# Patient Record
Sex: Female | Born: 1975 | Race: White | Hispanic: No | Smoking: Current every day smoker
Health system: Southern US, Community
[De-identification: ages and names within clinical notes are randomized; demographics above are authoritative.]

## PROBLEM LIST (undated history)

## (undated) DIAGNOSIS — K219 Gastro-esophageal reflux disease without esophagitis: Secondary | ICD-10-CM

## (undated) DIAGNOSIS — R569 Unspecified convulsions: Secondary | ICD-10-CM

## (undated) DIAGNOSIS — F319 Bipolar disorder, unspecified: Secondary | ICD-10-CM

## (undated) DIAGNOSIS — M1990 Unspecified osteoarthritis, unspecified site: Secondary | ICD-10-CM

## (undated) DIAGNOSIS — IMO0002 Reserved for concepts with insufficient information to code with codable children: Secondary | ICD-10-CM

## (undated) DIAGNOSIS — F909 Attention-deficit hyperactivity disorder, unspecified type: Secondary | ICD-10-CM

## (undated) HISTORY — PX: OTHER SURGICAL HISTORY: SHX169

## (undated) HISTORY — PX: TUBAL LIGATION: SHX77

## (undated) HISTORY — DX: Reserved for concepts with insufficient information to code with codable children: IMO0002

## (undated) HISTORY — DX: Attention-deficit hyperactivity disorder, unspecified type: F90.9

## (undated) HISTORY — DX: Gastro-esophageal reflux disease without esophagitis: K21.9

## (undated) HISTORY — DX: Unspecified osteoarthritis, unspecified site: M19.90

## (undated) HISTORY — DX: Bipolar disorder, unspecified: F31.9

## (undated) HISTORY — DX: Unspecified convulsions: R56.9

---

## 2014-05-02 ENCOUNTER — Emergency Department: Payer: Self-pay | Admitting: Student

## 2015-07-12 ENCOUNTER — Other Ambulatory Visit (HOSPITAL_COMMUNITY)
Admission: RE | Admit: 2015-07-12 | Discharge: 2015-07-12 | Disposition: A | Discharge status: Home / Self Care | Payer: BLUE CROSS/BLUE SHIELD | Source: Ambulatory Visit | Attending: Obstetrics and Gynecology | Admitting: Obstetrics and Gynecology

## 2015-07-12 DIAGNOSIS — Z01411 Encounter for gynecological examination (general) (routine) with abnormal findings: Secondary | ICD-10-CM | POA: Insufficient documentation

## 2015-07-12 DIAGNOSIS — Z1151 Encounter for screening for human papillomavirus (HPV): Secondary | ICD-10-CM | POA: Diagnosis present

## 2018-05-27 ENCOUNTER — Emergency Department (HOSPITAL_COMMUNITY): Payer: BLUE CROSS/BLUE SHIELD

## 2018-05-27 ENCOUNTER — Emergency Department (HOSPITAL_COMMUNITY)
Admission: EM | Admit: 2018-05-27 | Discharge: 2018-05-27 | Disposition: A | Discharge status: Home / Self Care | Payer: BLUE CROSS/BLUE SHIELD | Attending: Emergency Medicine | Admitting: Emergency Medicine

## 2018-05-27 ENCOUNTER — Other Ambulatory Visit: Payer: Self-pay

## 2018-05-27 ENCOUNTER — Encounter (HOSPITAL_COMMUNITY): Payer: Self-pay | Admitting: Emergency Medicine

## 2018-05-27 DIAGNOSIS — R69 Illness, unspecified: Secondary | ICD-10-CM

## 2018-05-27 DIAGNOSIS — R05 Cough: Secondary | ICD-10-CM

## 2018-05-27 DIAGNOSIS — J111 Influenza due to unidentified influenza virus with other respiratory manifestations: Secondary | ICD-10-CM | POA: Insufficient documentation

## 2018-05-27 DIAGNOSIS — F17290 Nicotine dependence, other tobacco product, uncomplicated: Secondary | ICD-10-CM | POA: Insufficient documentation

## 2018-05-27 DIAGNOSIS — R059 Cough, unspecified: Secondary | ICD-10-CM

## 2018-05-27 LAB — PREGNANCY, URINE: Preg Test, Ur: NEGATIVE

## 2018-05-27 MED ORDER — HYDROCOD POLST-CPM POLST ER 10-8 MG/5ML PO SUER
5.0000 mL | Freq: Once | ORAL | Status: AC
  Administered 2018-05-27: 5 mL via ORAL
  Filled 2018-05-27: qty 5

## 2018-05-27 MED ORDER — PROMETHAZINE-DM 6.25-15 MG/5ML PO SYRP: 5.0000 mL | ORAL_SOLUTION | Freq: Four times a day (QID) | ORAL | 0 refills | Status: DC | PRN

## 2018-05-27 MED ORDER — ALBUTEROL SULFATE HFA 108 (90 BASE) MCG/ACT IN AERS: 1.0000 | INHALATION_SPRAY | Freq: Four times a day (QID) | RESPIRATORY_TRACT | 0 refills | Status: DC | PRN

## 2018-05-27 MED ORDER — ALBUTEROL SULFATE (2.5 MG/3ML) 0.083% IN NEBU
2.5000 mg | INHALATION_SOLUTION | Freq: Once | RESPIRATORY_TRACT | Status: AC
  Administered 2018-05-27: 2.5 mg via RESPIRATORY_TRACT
  Filled 2018-05-27: qty 3

## 2018-05-27 MED ORDER — IPRATROPIUM-ALBUTEROL 0.5-2.5 (3) MG/3ML IN SOLN
3.0000 mL | Freq: Once | RESPIRATORY_TRACT | Status: AC
  Administered 2018-05-27: 3 mL via RESPIRATORY_TRACT
  Filled 2018-05-27: qty 3

## 2018-05-27 MED ORDER — PREDNISONE 20 MG PO TABS: 40.0000 mg | ORAL_TABLET | Freq: Every day | ORAL | 0 refills | Status: DC

## 2018-05-27 NOTE — Discharge Instructions (Addendum)
1 to 2 puffs of the albuterol inhaler every 4-6 hours as needed.  Drink plenty of fluids.  Continue to alternate Tylenol and ibuprofen every 4 and 6 hours for fever and body aches.  Follow-up with your primary doctor or return here for any worsening symptoms.

## 2018-05-27 NOTE — ED Triage Notes (Signed)
Headache, body aches, and chills since Friday.

## 2018-05-27 NOTE — ED Provider Notes (Signed)
[facility] EMERGENCY DEPARTMENT Provider Note   CSN: [id number] Arrival date & time: 05/27/18  1338     History   Chief Complaint Chief Complaint  Patient presents with  . Influenza    HPI Emily [name] is a 43 y.o. female.  HPI   Emily [name] is a 43 y.o. female who presents to the Emergency Department complaining of generalized body aches, frontal headache, sweats and chills with cough.  Symptoms have been present since Friday.  She describes the cough as forceful and mostly nonproductive and the excessive coughing triggers the headache.  Fever at home is subjective but states she has intermittent sweats and chills which makes her believe she is also had a fever.  She is taking over-the-counter cold and flu medication with minimal to no relief.  She does endorse recent sick contacts.  She states the cough worsens with exertion and with lying down.  She has noticed some wheezing.  She denies chest pain, abdominal pain, nausea or vomiting, and dysuria.   History reviewed. No pertinent past medical history.  There are no active problems to display for this patient.   Past Surgical History:  Procedure Laterality Date  . TUBAL LIGATION       OB History    Gravida  3   Para  3   Term  3   Preterm      AB      Living        SAB      TAB      Ectopic      Multiple      Live Births               Home Medications    Prior to Admission medications   Not on File    Family History Family History  Problem Relation Age of Onset  . Heart disease Other   . Cancer Other     Social History Social History   Tobacco Use  . Smoking status: Current Some Day Smoker    Types: Cigars  . Smokeless tobacco: Former Engineer, water Use Topics  . Alcohol use: Yes    Comment: rarely  . Drug use: Never     Allergies   Patient has no known allergies.   Review of Systems Review of Systems  Constitutional: Positive for chills and fever. Negative  for appetite change.  HENT: Positive for congestion, rhinorrhea and sore throat. Negative for trouble swallowing and voice change.   Respiratory: Positive for cough and wheezing. Negative for chest tightness and shortness of breath.   Cardiovascular: Negative for chest pain.  Gastrointestinal: Negative for abdominal pain, diarrhea, nausea and vomiting.  Genitourinary: Negative for decreased urine volume and dysuria.  Musculoskeletal: Positive for myalgias (Generalized body aches). Negative for arthralgias.  Skin: Negative for rash.  Neurological: Positive for headaches. Negative for dizziness, syncope, weakness and numbness.  Hematological: Negative for adenopathy.     Physical Exam Updated Vital Signs BP 135/89 (BP Location: Right Arm)   Pulse 96   Temp 99.3 F (37.4 C) (Temporal)   Resp (!) 24   Ht 5\' 3"  (1.6 m)   Wt 90.7 kg   LMP 04/15/2018   SpO2 94%   BMI 35.43 kg/m   Physical Exam Vitals signs and nursing note reviewed.  Constitutional:      General: She is not in acute distress.    Appearance: Normal appearance. She is not ill-appearing or toxic-appearing.  HENT:  Head: Normocephalic.     Right Ear: Tympanic membrane and ear canal normal.     Left Ear: Tympanic membrane and ear canal normal.     Nose: Congestion present. No rhinorrhea.     Mouth/Throat:     Mouth: Mucous membranes are moist.     Pharynx: Posterior oropharyngeal erythema present.     Comments: Erythema of the oropharynx without exudate or edema.  Uvula is midline and nonedematous. Neck:     Musculoskeletal: Normal range of motion. No neck rigidity.  Cardiovascular:     Rate and Rhythm: Normal rate and regular rhythm.     Pulses: Normal pulses.  Pulmonary:     Effort: Pulmonary effort is normal.     Breath sounds: Wheezing present.     Comments: Lung sounds slightly diminished bilaterally with expiratory wheezes present.  No rales. Musculoskeletal: Normal range of motion.     Right lower  leg: No edema.     Left lower leg: No edema.  Lymphadenopathy:     Cervical: No cervical adenopathy.  Skin:    General: Skin is warm.     Capillary Refill: Capillary refill takes less than 2 seconds.     Findings: No rash.  Neurological:     General: No focal deficit present.     Mental Status: She is alert.     Motor: No weakness.      ED Treatments / Results  Labs (all labs ordered are listed, but only abnormal results are displayed) Labs Reviewed  PREGNANCY, URINE    EKG None  Radiology Dg Chest 2 View  Result Date: 05/27/2018 CLINICAL DATA:  Headache, body aches, chills EXAM: CHEST - 2 VIEW COMPARISON:  05/02/2014 FINDINGS: The heart size and mediastinal contours are within normal limits. Both lungs are clear. The visualized skeletal structures are unremarkable. IMPRESSION: No active cardiopulmonary disease. Electronically Signed   By: Elige Ko   On: 05/27/2018 15:42    Procedures Procedures (including critical care time)  Medications Ordered in ED Medications  ipratropium-albuterol (DUONEB) 0.5-2.5 (3) MG/3ML nebulizer solution 3 mL (has no administration in time range)  albuterol (PROVENTIL) (2.5 MG/3ML) 0.083% nebulizer solution 2.5 mg (has no administration in time range)  chlorpheniramine-HYDROcodone (TUSSIONEX) 10-8 MG/5ML suspension 5 mL (has no administration in time range)     Initial Impression / Assessment and Plan / ED Course  I have reviewed the triage vital signs and the nursing notes.  Pertinent labs & imaging results that were available during my care of the patient were reviewed by me and considered in my medical decision making (see chart for details).    Patient well-appearing.  Nontoxic.  Cough generalized body aches and fever for 3 days.  Symptoms are likely viral.  She did have few scattered expiratory wheezes on initial exam that have improved after albuterol neb.  No hypoxia or tachycardia.  She appears appropriate for discharge home,  return precautions discussed.   Final Clinical Impressions(s) / ED Diagnoses   Final diagnoses:  Cough  Influenza-like illness    ED Discharge Orders    None       Rosey Bath 05/27/18 1702    Bethann Berkshire, MD 05/27/18 2332

## 2019-07-13 ENCOUNTER — Emergency Department (HOSPITAL_COMMUNITY): Payer: BC Managed Care – PPO

## 2019-07-13 ENCOUNTER — Encounter (HOSPITAL_COMMUNITY): Payer: Self-pay | Admitting: Emergency Medicine

## 2019-07-13 ENCOUNTER — Other Ambulatory Visit: Payer: Self-pay

## 2019-07-13 ENCOUNTER — Emergency Department (HOSPITAL_COMMUNITY)
Admission: EM | Admit: 2019-07-13 | Discharge: 2019-07-14 | Disposition: A | Discharge status: Home / Self Care | Payer: BC Managed Care – PPO | Attending: Emergency Medicine | Admitting: Emergency Medicine

## 2019-07-13 DIAGNOSIS — F17290 Nicotine dependence, other tobacco product, uncomplicated: Secondary | ICD-10-CM | POA: Diagnosis not present

## 2019-07-13 DIAGNOSIS — R112 Nausea with vomiting, unspecified: Secondary | ICD-10-CM | POA: Diagnosis not present

## 2019-07-13 DIAGNOSIS — R5383 Other fatigue: Secondary | ICD-10-CM | POA: Diagnosis not present

## 2019-07-13 DIAGNOSIS — N12 Tubulo-interstitial nephritis, not specified as acute or chronic: Secondary | ICD-10-CM | POA: Insufficient documentation

## 2019-07-13 DIAGNOSIS — F1210 Cannabis abuse, uncomplicated: Secondary | ICD-10-CM | POA: Insufficient documentation

## 2019-07-13 DIAGNOSIS — R1110 Vomiting, unspecified: Secondary | ICD-10-CM | POA: Diagnosis present

## 2019-07-13 DIAGNOSIS — R109 Unspecified abdominal pain: Secondary | ICD-10-CM | POA: Insufficient documentation

## 2019-07-13 LAB — COMPREHENSIVE METABOLIC PANEL
ALT: 14 U/L (ref 0–44)
AST: 14 U/L — ABNORMAL LOW (ref 15–41)
Albumin: 4.3 g/dL (ref 3.5–5.0)
Alkaline Phosphatase: 65 U/L (ref 38–126)
Anion gap: 10 (ref 5–15)
BUN: 9 mg/dL (ref 6–20)
CO2: 25 mmol/L (ref 22–32)
Calcium: 9.5 mg/dL (ref 8.9–10.3)
Chloride: 105 mmol/L (ref 98–111)
Creatinine, Ser: 0.58 mg/dL (ref 0.44–1.00)
GFR calc Af Amer: >60 mL/min (ref >60)
GFR calc non Af Amer: >60 mL/min (ref >60)
Glucose, Bld: 105 mg/dL — ABNORMAL HIGH (ref 70–99)
Potassium: 3.8 mmol/L (ref 3.5–5.1)
Sodium: 140 mmol/L (ref 135–145)
Total Bilirubin: 0.7 mg/dL (ref 0.3–1.2)
Total Protein: 7.8 g/dL (ref 6.5–8.1)

## 2019-07-13 LAB — CBC
HCT: 44.4 % (ref 36.0–46.0)
Hemoglobin: 14.4 g/dL (ref 12.0–15.0)
MCH: 28.9 pg (ref 26.0–34.0)
MCHC: 32.4 g/dL (ref 30.0–36.0)
MCV: 89.2 fL (ref 80.0–100.0)
Platelets: 478 10*3/uL — ABNORMAL HIGH (ref 150–400)
RBC: 4.98 MIL/uL (ref 3.87–5.11)
RDW: 15 % (ref 11.5–15.5)
WBC: 17.1 10*3/uL — ABNORMAL HIGH (ref 4.0–10.5)
nRBC: 0 % (ref 0.0–0.2)

## 2019-07-13 LAB — URINALYSIS, ROUTINE W REFLEX MICROSCOPIC
Bilirubin Urine: NEGATIVE
Glucose, UA: NEGATIVE mg/dL
Ketones, ur: 20 mg/dL — AB
Nitrite: NEGATIVE
Protein, ur: 100 mg/dL — AB
Specific Gravity, Urine: 1.021 (ref 1.005–1.030)
pH: 6 (ref 5.0–8.0)

## 2019-07-13 LAB — PREGNANCY, URINE: Preg Test, Ur: NEGATIVE

## 2019-07-13 MED ORDER — ONDANSETRON HCL 4 MG/2ML IJ SOLN
4.0000 mg | Freq: Once | INTRAMUSCULAR | Status: AC
  Administered 2019-07-13: 4 mg via INTRAVENOUS
  Filled 2019-07-13: qty 2

## 2019-07-13 MED ORDER — SODIUM CHLORIDE 0.9 % IV BOLUS
1000.0000 mL | Freq: Once | INTRAVENOUS | Status: AC
  Administered 2019-07-13: 22:00:00 1000 mL via INTRAVENOUS

## 2019-07-13 NOTE — ED Provider Notes (Signed)
[facility]-EMERGENCY DEPT [facility] Emergency Department Provider Note MRN:  [id number]  Arrival date & time: 07/14/19     Chief Complaint   Emesis   History of Present Illness   Emily [name] is a 44 y.o. year-old female with a history of tubal ligation presenting to the ED with chief complaint of emesis.  2 days of persistent nonbloody nonbilious emesis.  Explained that she is working overtime and is exhausted.  Denies headache or head trauma, no numbness or weakness, no chest pain or shortness of breath, no abdominal pain, no vaginal bleeding or discharge.  Endorsing moderate to severe right flank pain, gradual onset.  Symptoms constant, moderate, no exacerbating or alleviating factors.  Review of Systems  A complete 10 system review of systems was obtained and all systems are negative except as noted in the HPI and PMH.   Patient's Health History   History reviewed. No pertinent past medical history.  Past Surgical History:  Procedure Laterality Date  . TUBAL LIGATION      Family History  Problem Relation Age of Onset  . Heart disease Other   . Cancer Other     Social History   Socioeconomic History  . Marital status: Divorced    Spouse name: Not on file  . Number of children: Not on file  . Years of education: Not on file  . Highest education level: Not on file  Occupational History  . Not on file  Tobacco Use  . Smoking status: Current Some Day Smoker    Types: Cigars  . Smokeless tobacco: Former Engineer, water and Sexual Activity  . Alcohol use: Yes    Comment: rarely  . Drug use: Yes    Types: Marijuana    Comment: last time 07/11/19  . Sexual activity: Not on file  Other Topics Concern  . Not on file  Social History Narrative  . Not on file   Social Determinants of Health   Financial Resource Strain:   . Difficulty of Paying Living Expenses:   Food Insecurity:   . Worried About Programme researcher, broadcasting/film/video in the Last Year:   . Barista in the  Last Year:   Transportation Needs:   . Freight forwarder (Medical):   Marland Kitchen Lack of Transportation (Non-Medical):   Physical Activity:   . Days of Exercise per Week:   . Minutes of Exercise per Session:   Stress:   . Feeling of Stress :   Social Connections:   . Frequency of Communication with Friends and Family:   . Frequency of Social Gatherings with Friends and Family:   . Attends Religious Services:   . Active Member of Clubs or Organizations:   . Attends Banker Meetings:   Marland Kitchen Marital Status:   Intimate Partner Violence:   . Fear of Current or Ex-Partner:   . Emotionally Abused:   Marland Kitchen Physically Abused:   . Sexually Abused:      Physical Exam   Vitals:   07/13/19 2300 07/13/19 2330  BP: 140/90 (!) 147/92  Pulse: 76 73  Resp: 15 15  Temp:    SpO2: 95% 96%    CONSTITUTIONAL: Well-appearing, NAD NEURO:  Alert and oriented x 3, normal and symmetric strength and sensation, normal coordination, normal speech EYES:  eyes equal and reactive ENT/NECK:  no LAD, no JVD CARDIO: Regular rate, well-perfused, normal S1 and S2 PULM:  CTAB no wheezing or rhonchi GI/GU:  normal bowel sounds, non-distended, non-tender  MSK/SPINE:  No gross deformities, no edema SKIN:  no rash, atraumatic PSYCH:  Appropriate speech and behavior  *Additional and/or pertinent findings included in MDM below  Diagnostic and Interventional Summary    EKG Interpretation  Date/Time:  Sunday July 13 2019 22:02:45 EDT Ventricular Rate:  72 PR Interval:    QRS Duration: 86 QT Interval:  418 QTC Calculation: 458 R Axis:   75 Text Interpretation: Sinus rhythm No previous ECGs available Confirmed by Lihanna Biever ([id number]) on 07/13/2019 10:28:43 PM      Labs Reviewed  URINALYSIS, ROUTINE W REFLEX MICROSCOPIC - Abnormal; Notable for the following components:      Result Value   APPearance CLOUDY (*)    Hgb urine dipstick SMALL (*)    Ketones, ur 20 (*)    Protein, ur 100 (*)     Leukocytes,Ua TRACE (*)    Bacteria, UA RARE (*)    All other components within normal limits  COMPREHENSIVE METABOLIC PANEL - Abnormal; Notable for the following components:   Glucose, Bld 105 (*)    AST 14 (*)    All other components within normal limits  CBC - Abnormal; Notable for the following components:   WBC 17.1 (*)    Platelets 478 (*)    All other components within normal limits  PREGNANCY, URINE    CT RENAL STONE STUDY  Final Result      Medications  cephALEXin (KEFLEX) capsule 500 mg (has no administration in time range)  sodium chloride 0.9 % bolus 1,000 mL (0 mLs Intravenous Stopped 07/13/19 2259)  ondansetron (ZOFRAN) injection 4 mg (4 mg Intravenous Given 07/13/19 2154)     Procedures  /  Critical Care Procedures  ED Course and Medical Decision Making  I have reviewed the triage vital signs, the nursing notes, and pertinent available records from the EMR.  Pertinent labs & imaging results that were available during my care of the patient were reviewed by me and considered in my medical decision making (see below for details).     Vomiting, nothing to suggest CNS etiology, abdomen is soft and nontender  Endorsing right flank pain, considering pyelonephritis, work-up pending.  Labs reveal leukocytosis, mild leukoesterase on urinalysis.  CT scan obtained to exclude possibility of infected kidney stone given the right flank pain, no stone, some indirect evidence of pyelonephritis.  Also some findings of cholelithiasis however patient is without any right upper quadrant tenderness on reassessment.  Patient is now feeling much better, tolerating p.o., appropriate for discharge with antibiotics.  Umer Harig M. Ladon Vandenberghe, MD  Emergency Medicine [facility] [email or web address]  Final Clinical Impressions(s) / ED Diagnoses     ICD-10-CM   1. Non-intractable vomiting with nausea, unspecified vomiting type  R11.2   2. Pyelonephritis  N12     ED  Discharge Orders         Ordered    cephALEXin (KEFLEX) 500 MG capsule  4 times daily     07/14/19 0003    ondansetron (ZOFRAN ODT) 4 MG disintegrating tablet  Every 8 hours PRN     03 /15/21 0004           Discharge Instructions Discussed with and Provided to Patient:     Discharge Instructions     You were evaluated in the Emergency Department and after careful evaluation, we did not find any emergent condition requiring admission or further testing in the hospital.  Your exam/testing today is overall reassuring.  Your symptoms seem to  be due to a kidney infection.  Please take the antibiotics as directed.  You can use the Zofran nausea medication as needed.  We found that you have stones in your gallbladder.  This should be followed up by general surgeon.  Please return to the Emergency Department if you experience any worsening of your condition.  We encourage you to follow up with a primary care provider.  Thank you for allowing [place] to be a part of your care.       Maudie Flakes, MD 07/14/19 [phone]

## 2019-07-13 NOTE — ED Triage Notes (Signed)
Pt c/o emesis x 2 days. Pt states that she has been unable to keep anything down.

## 2019-07-13 NOTE — ED Notes (Signed)
Pt resting comfortably, NAD noted

## 2019-07-14 MED ORDER — CEPHALEXIN 500 MG PO CAPS
500.0000 mg | ORAL_CAPSULE | Freq: Once | ORAL | Status: AC
  Administered 2019-07-14: 500 mg via ORAL
  Filled 2019-07-14: qty 1

## 2019-07-14 MED ORDER — ONDANSETRON 4 MG PO TBDP: 4.0000 mg | ORAL_TABLET | Freq: Three times a day (TID) | ORAL | 0 refills | Status: DC | PRN

## 2019-07-14 MED ORDER — CEPHALEXIN 500 MG PO CAPS: 500.0000 mg | ORAL_CAPSULE | Freq: Four times a day (QID) | ORAL | 0 refills | Status: AC

## 2019-07-14 NOTE — Discharge Instructions (Addendum)
You were evaluated in the Emergency Department and after careful evaluation, we did not find any emergent condition requiring admission or further testing in the hospital.  Your exam/testing today is overall reassuring.  Your symptoms seem to be due to a kidney infection.  Please take the antibiotics as directed.  You can use the Zofran nausea medication as needed.  We found that you have stones in your gallbladder.  This should be followed up by general surgeon.  Please return to the Emergency Department if you experience any worsening of your condition.  We encourage you to follow up with a primary care provider.  Thank you for allowing [place] to be a part of your care.

## 2021-04-20 ENCOUNTER — Encounter: Payer: Self-pay | Admitting: Physician Assistant

## 2021-04-20 ENCOUNTER — Ambulatory Visit (INDEPENDENT_AMBULATORY_CARE_PROVIDER_SITE_OTHER): Payer: BC Managed Care – PPO | Admitting: Physician Assistant

## 2021-04-20 VITALS — BP 142/80 | HR 87 | Temp 98.6°F | Ht 63.39 in | Wt 175.8 lb

## 2021-04-20 DIAGNOSIS — R41840 Attention and concentration deficit: Secondary | ICD-10-CM

## 2021-04-20 DIAGNOSIS — Z0001 Encounter for general adult medical examination with abnormal findings: Secondary | ICD-10-CM | POA: Diagnosis not present

## 2021-04-20 DIAGNOSIS — Z23 Encounter for immunization: Secondary | ICD-10-CM

## 2021-04-20 DIAGNOSIS — N879 Dysplasia of cervix uteri, unspecified: Secondary | ICD-10-CM

## 2021-04-20 DIAGNOSIS — R030 Elevated blood-pressure reading, without diagnosis of hypertension: Secondary | ICD-10-CM

## 2021-04-20 DIAGNOSIS — Z1211 Encounter for screening for malignant neoplasm of colon: Secondary | ICD-10-CM

## 2021-04-20 DIAGNOSIS — N941 Unspecified dyspareunia: Secondary | ICD-10-CM

## 2021-04-20 DIAGNOSIS — E669 Obesity, unspecified: Secondary | ICD-10-CM

## 2021-04-20 DIAGNOSIS — Z136 Encounter for screening for cardiovascular disorders: Secondary | ICD-10-CM

## 2021-04-20 DIAGNOSIS — M2550 Pain in unspecified joint: Secondary | ICD-10-CM

## 2021-04-20 DIAGNOSIS — F319 Bipolar disorder, unspecified: Secondary | ICD-10-CM | POA: Diagnosis not present

## 2021-04-20 DIAGNOSIS — Z13220 Encounter for screening for lipoid disorders: Secondary | ICD-10-CM

## 2021-04-20 DIAGNOSIS — N926 Irregular menstruation, unspecified: Secondary | ICD-10-CM

## 2021-04-20 NOTE — Patient Instructions (Signed)
It was great to see you!  Referrals today for: -Gynecology -Colonoscopy  -Psychiatry for ADHD testing (typically we start with [facility])  You do not need referral for mammogram -- refer to handout and choose [facility] or [facility] and call for appointment  Please make an appointment with the lab on your way out. I would like for you to return for lab work within 1-2 weeks. After midnight on the day of the lab draw, please do not eat anything. You may have water, black coffee, unsweetened tea.  Follow-up will be based on blood work results.  If a referral was placed today, you will be contacted for an appointment. Please note that routine referrals can sometimes take up to 3-4 weeks to process. Please call our office if you haven't heard anything after this time frame.  Take care,  Jarold Motto PA-C

## 2021-04-20 NOTE — Progress Notes (Signed)
Subjective:    Emily [name] is a 45 y.o. female and is here for a comprehensive physical exam.   HPI  Health Maintenance Due  Topic Date Due   COVID-19 Vaccine (1) Never done   Pneumococcal Vaccine 68-43 Years old (1 - PCV) Never done   HIV Screening  Never done   Hepatitis C Screening  Never done   PAP SMEAR-Modifier  Never done   COLONOSCOPY (Pts 45-44yrs Insurance coverage will need to be confirmed)  Never done   INFLUENZA VACCINE  Never done    Acute Concerns: Elevated blood pressure reading - Currently not taking any medication. At home blood pressure readings are: not regularly checked. Patient denies chest pain, SOB, blurred vision, dizziness, unusual headaches, lower leg swelling. Denies excessive caffeine intake, stimulant usage, excessive alcohol intake, or increase in salt consumption.  BP Readings from Last 3 Encounters:  04/20/21 (!) 142/80  07/14/19 134/86  05/27/18 129/75   Dyspareunia --patient reports that last time she had sex she had pain with penetration.  She denies unusual vaginal discharge or bleeding afterwards.  She has not seen gynecology in a few years.  Chronic Issues: Attention issues --patient reports issues with attention and juggling numerous mental tasks over the past few years.  She has trialed a friend's Adderall and found relief of symptoms.  She feels like her attention deficit disrupts her quality of life.  She recently started working a second job for extra income and feels more stressed with this.  She is wondering if she needs a stimulant regularly for her attention.  Irregular bleeding --her periods are becoming irregular, she is having months where she skips her period.  She does have history of heavy periods but this has improved with time.  She has not seen gynecology in a few years.  She is overdue for Pap smear.  Cervical dysplasia --reports that she had possible cervical cancer?  And had a possible LEEP procedure when she was a  young teen?  We do not have these records and she is overdue for Pap smear.  She has had 3 vaginal births.   Low back pain --she reports that she feels like she carries tension throughout her back.  She takes Motrin as needed for her back pain.  She feels like if she could improve her stress her back pain would improve.  Denies urinary symptoms are history of frequent kidney stones.  Denies family history of rheumatoid arthritis.  Bipolar 1 disorder --when she was 15 she was diagnosed with bipolar disorder.  She was also institutionalized and put on medication.  She states at one point she was on Pamelor and did not do well with this.  She has taken Seroquel in the past and has done well with this but thought that it was may be contributing to elevated blood pressure.  She denies suicidal or homicidal ideation at this time.  Health Maintenance: Immunizations --update Tdap today Colonoscopy --agreeable to order today Mammogram --no prior mammogram, counseled, agreeable PAP --overdue, would like referral Diet -- has reduced CHO intake [name]; tries to eat high-protein Exercise --very active with her work Weight -- Weight: 175 lb 12.8 oz (79.7 kg)  -- was 220 lb; losing weight intentionally Weight history: Wt Readings from Last 10 Encounters:  04/20/21 175 lb 12.8 oz (79.7 kg)  07/13/19 180 lb (81.6 kg)  05/27/18 200 lb (90.7 kg)   Body mass index is 30.76 kg/m. Patient's last menstrual period was 03/05/2021 (exact  date). Alcohol use:  reports current alcohol use. Tobacco use:  Tobacco Use: High Risk   Smoking Tobacco Use: Some Days   Smokeless Tobacco Use: Former   Passive Exposure: Not on file     Depression screen [facility] 2/9 04/20/2021  Decreased Interest 3  Down, Depressed, Hopeless 1  PHQ - 2 Score 4  Altered sleeping 3  Tired, decreased energy 3  Change in appetite 0  Feeling bad or failure about yourself  0  Trouble concentrating 3  Moving slowly or fidgety/restless 2   Suicidal thoughts 0  PHQ-9 Score 15  Difficult doing work/chores Very difficult     Other providers/specialists: Patient Care Team: Jarold Motto, [place] as PCP - General (Physician Assistant)    PMHx, SurgHx, SocialHx, Medications, and Allergies were reviewed in the Visit Navigator and updated as appropriate.   Past Medical History:  Diagnosis Date   Bipolar 1 disorder (HCC)    Ulcer 25+ yrs ago   stress related     Past Surgical History:  Procedure Laterality Date   TUBAL LIGATION     VAGINAL DELIVERY     x3     Family History  Problem Relation Age of Onset   COPD Mother    Heart disease Mother    Hypertension Mother    Cancer Mother    Heart attack Father    Hypertension Father    Heart disease Maternal Grandmother    Cancer Maternal Aunt    Cancer Maternal Aunt    Lupus Maternal Aunt    Heart disease Other    Cancer Other     Social History   Tobacco Use   Smoking status: Some Days    Types: Cigars   Smokeless tobacco: Former  Building services engineer Use: Former  Substance Use Topics   Alcohol use: Yes    Comment: rarely   Drug use: Yes    Types: Marijuana    Comment: regularly    Review of Systems:   Review of Systems  Constitutional:  Negative for chills, fever, malaise/fatigue and weight loss.  HENT:  Negative for hearing loss, sinus pain and sore throat.   Respiratory:  Negative for cough and hemoptysis.   Cardiovascular:  Negative for chest pain, palpitations, leg swelling and PND.  Gastrointestinal:  Negative for abdominal pain, constipation, diarrhea, heartburn, nausea and vomiting.  Genitourinary:  Negative for dysuria, frequency and urgency.  Musculoskeletal:  Negative for back pain, myalgias and neck pain.  Skin:  Negative for itching and rash.  Neurological:  Negative for dizziness, tingling, seizures and headaches.  Endo/Heme/Allergies:  Negative for polydipsia.  Psychiatric/Behavioral:  Negative for depression. The patient is not  nervous/anxious.    Objective:   BP (!) 142/80 (BP Location: Right Arm)    Pulse 87    Temp 98.6 F (37 C) (Temporal)    Ht 5' 3.39" (1.61 m)    Wt 175 lb 12.8 oz (79.7 kg)    LMP 03/05/2021 (Exact Date)    SpO2 97%    BMI 30.76 kg/m  Body mass index is 30.76 kg/m.   General Appearance:    Alert, cooperative, no distress, appears stated age  Head:    Normocephalic, without obvious abnormality, atraumatic  Eyes:    PERRL, conjunctiva/corneas clear, EOM's intact, fundi    benign, both eyes  Ears:    Normal TM's and external ear canals, both ears  Nose:   Nares normal, septum midline, mucosa normal, no drainage  or sinus tenderness  Throat:   Lips, mucosa, and tongue normal; teeth and gums normal  Neck:   Supple, symmetrical, trachea midline, no adenopathy;    thyroid:  no enlargement/tenderness/nodules; no carotid   bruit or JVD  Back:     Symmetric, no curvature, ROM normal, no CVA tenderness  Lungs:     Clear to auscultation bilaterally, respirations unlabored  Chest Wall:    No tenderness or deformity   Heart:    Regular rate and rhythm, S1 and S2 normal, no murmur, rub or gallop  Breast Exam:   Deferred  Abdomen:     Soft, non-tender, bowel sounds active all four quadrants,    no masses, no organomegaly  Genitalia:    Deferred  Extremities:   Extremities normal, atraumatic, no cyanosis or edema  Pulses:   2+ and symmetric all extremities  Skin:   Skin color, texture, turgor normal, no rashes or lesions  Lymph nodes:   Cervical, supraclavicular, and axillary nodes normal  Neurologic:   CNII-XII intact, normal strength, sensation and reflexes    throughout    Assessment/Plan:   Encounter for general adult medical examination with abnormal findings Today patient counseled on age appropriate routine health concerns for screening and prevention, each reviewed and up to date or declined. Immunizations reviewed and up to date or declined. Labs ordered and reviewed. Risk factors  for depression reviewed and negative. Hearing function and visual acuity are intact. ADLs screened and addressed as needed. Functional ability and level of safety reviewed and appropriate. Education, counseling and referrals performed based on assessed risks today. Patient provided with a copy of personalized plan for preventive services.  Elevated blood pressure reading Slightly above goal today Continue to monitor at home If numbers are consistently greater than 140/90, I have asked patient to let us know and we will consider starting low-dose medication for her blood pressure Also update blood work today as it has been quite sometime since this has been done  Dyspareunia in female Offered evaluation today but she declined Will refer to gynecology  Bipolar 1 disorder (HCC) Patient reports that this is controlled and would not like further evaluation today I discussed with patient that if they develop any SI, to tell someone immediately and seek medical attention.  Attention deficit Uncontrolled I discussed with patient that I do not diagnose ADHD Will refer to [place] [facility] for further evaluation and management  Cervical dysplasia Referral to gynecology for updated Pap smear  Irregular menstrual bleeding Referral to gynecology  Arthralgia, unspecified joint No red flags We did discuss briefly evaluating for any autoimmune disorder but we decided to first start working to work on her mental health first to see if that can help with her [name] tension that she may be carrying in her muscles Patient agreeable to this plan and will follow-up as needed  Obesity, unspecified classification, unspecified obesity type, unspecified whether serious comorbidity present Doing well with weight loss, commended efforts  Encounter for lipid screening for cardiovascular disease Update lipid panel and provide recommendations accordingly  Special screening for malignant  neoplasms, colon Gastroenterology referral placed  Need for prophylactic vaccination with combined diphtheria-tetanus-pertussis (DTP) vaccine Tdap given today    Patient Counseling: [x]    Nutrition: Stressed importance of moderation in sodium/caffeine intake, saturated fat and cholesterol, caloric balance, sufficient intake of fresh fruits, vegetables, fiber, calcium, iron, and 1 mg of folate supplement per day (for females capable of pregnancy).  [x]    Stressed the  importance of regular exercise.   [x]    Substance Abuse: Discussed cessation/primary prevention of tobacco, alcohol, or other drug use; driving or other dangerous activities under the influence; availability of treatment for abuse.   [x]    Injury prevention: Discussed safety belts, safety helmets, smoke detector, smoking near bedding or upholstery.   [x]    Sexuality: Discussed sexually transmitted diseases, partner selection, use of condoms, avoidance of unintended pregnancy  and contraceptive alternatives.  [x]    Dental health: Discussed importance of regular tooth brushing, flossing, and dental visits.  [x]    Health maintenance and immunizations reviewed. Please refer to Health maintenance section.    , PA-C [facility]

## 2021-05-24 ENCOUNTER — Encounter: Payer: Self-pay | Admitting: Gastroenterology

## 2021-05-24 ENCOUNTER — Other Ambulatory Visit: Payer: Self-pay | Admitting: Physician Assistant

## 2021-05-24 DIAGNOSIS — Z1231 Encounter for screening mammogram for malignant neoplasm of breast: Secondary | ICD-10-CM

## 2021-05-25 ENCOUNTER — Other Ambulatory Visit: Payer: Self-pay | Admitting: Physician Assistant

## 2021-05-25 ENCOUNTER — Other Ambulatory Visit (INDEPENDENT_AMBULATORY_CARE_PROVIDER_SITE_OTHER): Payer: BC Managed Care – PPO

## 2021-05-25 ENCOUNTER — Encounter: Payer: Self-pay | Admitting: Physician Assistant

## 2021-05-25 DIAGNOSIS — E669 Obesity, unspecified: Secondary | ICD-10-CM

## 2021-05-25 DIAGNOSIS — Z136 Encounter for screening for cardiovascular disorders: Secondary | ICD-10-CM

## 2021-05-25 DIAGNOSIS — Z13220 Encounter for screening for lipoid disorders: Secondary | ICD-10-CM | POA: Diagnosis not present

## 2021-05-25 DIAGNOSIS — D72829 Elevated white blood cell count, unspecified: Secondary | ICD-10-CM

## 2021-05-25 DIAGNOSIS — E88819 Insulin resistance, unspecified: Secondary | ICD-10-CM | POA: Insufficient documentation

## 2021-05-25 DIAGNOSIS — E8881 Metabolic syndrome: Secondary | ICD-10-CM | POA: Insufficient documentation

## 2021-05-25 DIAGNOSIS — E785 Hyperlipidemia, unspecified: Secondary | ICD-10-CM | POA: Insufficient documentation

## 2021-05-25 HISTORY — DX: Hyperlipidemia, unspecified: E78.5

## 2021-05-25 LAB — CBC WITH DIFFERENTIAL/PLATELET
Basophils Absolute: 0 10*3/uL (ref 0.0–0.1)
Basophils Relative: 0.3 % (ref 0.0–3.0)
Eosinophils Absolute: 0.2 10*3/uL (ref 0.0–0.7)
Eosinophils Relative: 1.9 % (ref 0.0–5.0)
HCT: 41.3 % (ref 36.0–46.0)
Hemoglobin: 13.8 g/dL (ref 12.0–15.0)
Lymphocytes Relative: 26.4 % (ref 12.0–46.0)
Lymphs Abs: 3.4 10*3/uL (ref 0.7–4.0)
MCHC: 33.3 g/dL (ref 30.0–36.0)
MCV: 92.1 fl (ref 78.0–100.0)
Monocytes Absolute: 0.8 10*3/uL (ref 0.1–1.0)
Monocytes Relative: 6.1 % (ref 3.0–12.0)
Neutro Abs: 8.4 10*3/uL — ABNORMAL HIGH (ref 1.4–7.7)
Neutrophils Relative %: 65.3 % (ref 43.0–77.0)
Platelets: 478 10*3/uL — ABNORMAL HIGH (ref 150.0–400.0)
RBC: 4.49 Mil/uL (ref 3.87–5.11)
RDW: 14.2 % (ref 11.5–15.5)
WBC: 12.8 10*3/uL — ABNORMAL HIGH (ref 4.0–10.5)

## 2021-05-25 LAB — COMPREHENSIVE METABOLIC PANEL
ALT: 14 U/L (ref 0–35)
AST: 12 U/L (ref 0–37)
Albumin: 4.2 g/dL (ref 3.5–5.2)
Alkaline Phosphatase: 64 U/L (ref 39–117)
BUN: 15 mg/dL (ref 6–23)
CO2: 28 mEq/L (ref 19–32)
Calcium: 9.6 mg/dL (ref 8.4–10.5)
Chloride: 104 mEq/L (ref 96–112)
Creatinine, Ser: 0.67 mg/dL (ref 0.40–1.20)
GFR: 104.77 mL/min (ref >60.00)
Glucose, Bld: 89 mg/dL (ref 70–99)
Potassium: 3.9 mEq/L (ref 3.5–5.1)
Sodium: 140 mEq/L (ref 135–145)
Total Bilirubin: 0.4 mg/dL (ref 0.2–1.2)
Total Protein: 7.3 g/dL (ref 6.0–8.3)

## 2021-05-25 LAB — LIPID PANEL
Cholesterol: 202 mg/dL — ABNORMAL HIGH (ref 0–200)
HDL: 42.6 mg/dL (ref >39.00)
LDL Cholesterol: 133 mg/dL — ABNORMAL HIGH (ref 0–99)
NonHDL: 159.07
Total CHOL/HDL Ratio: 5
Triglycerides: 131 mg/dL (ref 0.0–149.0)
VLDL: 26.2 mg/dL (ref 0.0–40.0)

## 2021-05-25 LAB — HEMOGLOBIN A1C: Hgb A1c MFr Bld: 6 % (ref 4.6–6.5)

## 2021-05-25 LAB — TSH: TSH: 1.45 u[IU]/mL (ref 0.35–5.50)

## 2021-06-10 ENCOUNTER — Other Ambulatory Visit: Payer: Self-pay

## 2021-06-10 ENCOUNTER — Ambulatory Visit
Admission: RE | Admit: 2021-06-10 | Discharge: 2021-06-10 | Disposition: A | Discharge status: Home / Self Care | Payer: BC Managed Care – PPO | Source: Ambulatory Visit | Attending: Physician Assistant | Admitting: Physician Assistant

## 2021-06-10 DIAGNOSIS — Z1231 Encounter for screening mammogram for malignant neoplasm of breast: Secondary | ICD-10-CM | POA: Diagnosis not present

## 2021-06-14 ENCOUNTER — Other Ambulatory Visit: Payer: Self-pay | Admitting: Physician Assistant

## 2021-06-14 DIAGNOSIS — R928 Other abnormal and inconclusive findings on diagnostic imaging of breast: Secondary | ICD-10-CM

## 2021-06-24 ENCOUNTER — Other Ambulatory Visit: Payer: Self-pay

## 2021-06-24 ENCOUNTER — Ambulatory Visit (AMBULATORY_SURGERY_CENTER): Payer: BC Managed Care – PPO | Admitting: *Deleted

## 2021-06-24 VITALS — Ht 63.0 in | Wt 175.0 lb

## 2021-06-24 DIAGNOSIS — Z1211 Encounter for screening for malignant neoplasm of colon: Secondary | ICD-10-CM

## 2021-06-24 MED ORDER — NA SULFATE-K SULFATE-MG SULF 17.5-3.13-1.6 GM/177ML PO SOLN: 1.0000 | Freq: Once | ORAL | 0 refills | Status: AC

## 2021-06-24 NOTE — Progress Notes (Signed)
No egg or soy allergy known to patient  No issues known to pt with past sedation with any surgeries or procedures Patient denies ever being told they had issues or difficulty with intubation  No FH of Malignant Hyperthermia Pt is not on diet pills Pt is not on  home 02  Pt is not on blood thinners  Pt denies issues with constipation  No A fib or A flutter  Pt  vaccinated  for Covid    Due to the COVID-19 pandemic we are asking patients to follow certain guidelines in PV and the [place]   Pt aware of COVID protocols and [facility] guidelines   PV completed over the phone. Pt verified name, DOB, address and insurance during PV today.  Pt mailed instruction packet with copy of consent form to read and not return, and instructions.  Pt encouraged to call with questions or issues.  If pt has My chart, procedure instructions sent via My Chart

## 2021-06-30 ENCOUNTER — Encounter: Payer: Self-pay | Admitting: Gastroenterology

## 2021-06-30 DIAGNOSIS — F418 Other specified anxiety disorders: Secondary | ICD-10-CM | POA: Diagnosis not present

## 2021-06-30 DIAGNOSIS — Z124 Encounter for screening for malignant neoplasm of cervix: Secondary | ICD-10-CM | POA: Diagnosis not present

## 2021-06-30 DIAGNOSIS — Z6831 Body mass index (BMI) 31.0-31.9, adult: Secondary | ICD-10-CM | POA: Diagnosis not present

## 2021-06-30 DIAGNOSIS — Z113 Encounter for screening for infections with a predominantly sexual mode of transmission: Secondary | ICD-10-CM | POA: Diagnosis not present

## 2021-06-30 DIAGNOSIS — Z01419 Encounter for gynecological examination (general) (routine) without abnormal findings: Secondary | ICD-10-CM | POA: Diagnosis not present

## 2021-07-08 ENCOUNTER — Ambulatory Visit (AMBULATORY_SURGERY_CENTER): Payer: BC Managed Care – PPO | Admitting: Gastroenterology

## 2021-07-08 ENCOUNTER — Encounter: Payer: Self-pay | Admitting: Gastroenterology

## 2021-07-08 ENCOUNTER — Other Ambulatory Visit: Payer: Self-pay

## 2021-07-08 ENCOUNTER — Telehealth: Payer: Self-pay

## 2021-07-08 VITALS — BP 127/85 | HR 74 | Temp 98.6°F | Resp 13 | Ht 63.0 in | Wt 175.0 lb

## 2021-07-08 DIAGNOSIS — K635 Polyp of colon: Secondary | ICD-10-CM | POA: Diagnosis not present

## 2021-07-08 DIAGNOSIS — K633 Ulcer of intestine: Secondary | ICD-10-CM | POA: Diagnosis not present

## 2021-07-08 DIAGNOSIS — K621 Rectal polyp: Secondary | ICD-10-CM

## 2021-07-08 DIAGNOSIS — Z1211 Encounter for screening for malignant neoplasm of colon: Secondary | ICD-10-CM

## 2021-07-08 MED ORDER — SODIUM CHLORIDE 0.9 % IV SOLN: 500.0000 mL | INTRAVENOUS | Status: DC

## 2021-07-08 NOTE — Progress Notes (Signed)
Called to room to assist during endoscopic procedure.  Patient ID and intended procedure confirmed with present staff. Received instructions for my participation in the procedure from the performing physician.  

## 2021-07-08 NOTE — Telephone Encounter (Signed)
-----   Message from Thornton Park, MD sent at 07/08/2021  8:38 AM EST ----- ?This patient needs office follow-up with me or an APP - next available for ulcers. ? ?Thanks. ? ?KLB ? ?

## 2021-07-08 NOTE — Patient Instructions (Signed)
Handouts given for high fiber diet and polyps. Await pathology results. ? ?NO ASPIRIN, ASPIRIN CONTAINING PRODUCTS (BC OR GOODY POWDERS) OR NSAIDS (IBUPROFEN, ADVIL, ALEVE, AND MOTRIN) as this may be the cause of the small bowel ulcers.  ? ?YOU HAD AN ENDOSCOPIC PROCEDURE TODAY AT [facility]:   Refer to the procedure report that was given to you for any specific questions about what was found during the examination.  If the procedure report does not answer your questions, please call your gastroenterologist to clarify.  If you requested that your care partner not be given the details of your procedure findings, then the procedure report has been included in a sealed envelope for you to review at your convenience later. ? ?YOU SHOULD EXPECT: Some feelings of bloating in the abdomen. Passage of more gas than usual.  Walking can help get rid of the air that was put into your GI tract during the procedure and reduce the bloating. If you had a lower endoscopy (such as a colonoscopy or flexible sigmoidoscopy) you may notice spotting of blood in your stool or on the toilet paper. If you underwent a bowel prep for your procedure, you may not have a normal bowel movement for a few days. ? ?Please Note:  You might notice some irritation and congestion in your nose or some drainage.  This is from the oxygen used during your procedure.  There is no need for concern and it should clear up in a day or so. ? ?SYMPTOMS TO REPORT IMMEDIATELY: ? ?Following lower endoscopy (colonoscopy or flexible sigmoidoscopy): ? Excessive amounts of blood in the stool ? Significant tenderness or worsening of abdominal pains ? Swelling of the abdomen that is new, acute ? Fever of 100?F or higher ? ? ? ?For urgent or emergent issues, a gastroenterologist can be reached at any hour by calling [phone]. ?Do not use MyChart messaging for urgent concerns.  ? ? ?DIET:  We do recommend a small meal at first, but then you may proceed  to your regular diet.  Drink plenty of fluids but you should avoid alcoholic beverages for 24 hours. ? ?ACTIVITY:  You should plan to take it easy for the rest of today and you should NOT DRIVE or use heavy machinery until tomorrow (because of the sedation medicines used during the test).   ? ?FOLLOW UP: ?Our staff will call the number listed on your records 48-72 hours following your procedure to check on you and address any questions or concerns that you may have regarding the information given to you following your procedure. If we do not reach you, we will leave a message.  We will attempt to reach you two times.  During this call, we will ask if you have developed any symptoms of COVID 19. If you develop any symptoms (ie: fever, flu-like symptoms, shortness of breath, cough etc.) before then, please call [phone].  If you test positive for Covid 19 in the 2 weeks post procedure, please call and report this information to [place].   ? ?If any biopsies were taken you will be contacted by phone or by letter within the next 1-3 weeks.  Please call us at [phone] if you have not heard about the biopsies in 3 weeks.  ? ? ?SIGNATURES/CONFIDENTIALITY: ?You and/or your care partner have signed paperwork which will be entered into your electronic medical record.  These signatures attest to the fact that that the information above on your After Visit  Summary has been reviewed and is understood.  Full responsibility of the confidentiality of this discharge information lies with you and/or your care-partner.  ?

## 2021-07-08 NOTE — Progress Notes (Signed)
? ?  Referring Provider: Jarold Motto, PA ?Primary Care Physician:  Jarold Motto, PA ? ?Reason for Procedure:  Colon cancer screening ? ? ?IMPRESSION:  ?Need for colon cancer screening ?Appropriate candidate for monitored anesthesia care ? ?PLAN: ?Colonoscopy in the [facility] today ? ? ?HPI: Emily [name] is a 46 y.o. female presents for screening colonoscopy. ? ?No prior colonoscopy or colon cancer screening. ? ?No baseline GI symptoms.  ? ?No known family history of colon cancer or polyps. No family history of uterine/endometrial cancer, pancreatic cancer or gastric/stomach cancer. ? ? ?Past Medical History:  ?Diagnosis Date  ? Bipolar 1 disorder (HCC)   ? GERD (gastroesophageal reflux disease)   ? H/O ULCER  ? HLD (hyperlipidemia) 05/25/2021  ? Seizures (HCC)   ? AS A CHILD,ONCE STARTED SCHHOL NO OTHER SEIZURE  ? Ulcer 25+ yrs ago  ? stress related  ? ? ?Past Surgical History:  ?Procedure Laterality Date  ? TUBAL LIGATION    ? TUBES IN EARS AS A CHILD    ? VAGINAL DELIVERY    ? x3  ? ? ?Current Outpatient Medications  ?Medication Sig Dispense Refill  ? escitalopram (LEXAPRO) 10 MG tablet Lexapro 10 mg tablet ? Take 1 tablet every day by oral route. (Patient not taking: Reported on 07/08/2021)    ? ?Current Facility-Administered Medications  ?Medication Dose Route Frequency Provider Last Rate Last Admin  ? 0.9 %  sodium chloride infusion  500 mL Intravenous Continuous Tressia Danas, MD      ? ? ?Allergies as of 07/08/2021  ? (No Known Allergies)  ? ? ?Family History  ?Problem Relation Age of Onset  ? Breast cancer Mother   ? COPD Mother   ? Heart disease Mother   ? Hypertension Mother   ? Cancer Mother   ? Heart attack Father   ? Hypertension Father   ? Cancer Maternal Aunt   ? Cancer Maternal Aunt   ? Lupus Maternal Aunt   ? Heart disease Maternal Grandmother   ? Heart disease Other   ? Cancer Other   ? Colon cancer Neg Hx   ? Colon polyps Neg Hx   ? Esophageal cancer Neg Hx   ? Stomach cancer Neg Hx   ?  Rectal cancer Neg Hx   ? ? ? ?Physical Exam: ?General:   Alert,  well-nourished, pleasant and cooperative in NAD ?Head:  Normocephalic and atraumatic. ?Eyes:  Sclera clear, no icterus.   Conjunctiva pink. ?Mouth:  No deformity or lesions.   ?Neck:  Supple; no masses or thyromegaly. ?Lungs:  Clear throughout to auscultation.   No wheezes. ?Heart:  Regular rate and rhythm; no murmurs. ?Abdomen:  Soft, non-tender, nondistended, normal bowel sounds, no rebound or guarding.  ?Msk:  Symmetrical. No boney deformities ?LAD: No inguinal or umbilical LAD ?Extremities:  No clubbing or edema. ?Neurologic:  Alert and  oriented x4;  grossly nonfocal ?Skin:  No obvious rash or bruise. ?Psych:  Alert and cooperative. Normal mood and affect. ? ? ? ? ?Studies/Results: ?No results found. ? ? ? ?Tilly Pernice L. Orvan Falconer, MD, MPH ?07/08/2021, 8:07 AM ? ? ? ?  ?

## 2021-07-08 NOTE — Progress Notes (Signed)
Pt's states no medical or surgical changes since previsit or office visit. 

## 2021-07-08 NOTE — Op Note (Signed)
[facility] ?Patient Name: Emily [name] ?Procedure Date: 07/08/2021 8:00 AM ?MRN: [id number] ?Endoscopist: Tressia Danas MD, MD ?Age: 46 ?Referring MD:  ?Date of Birth: 1976-01-31 ?Gender: Female ?Account #: [id number] ?Procedure:                Colonoscopy ?Indications:              Screening for colorectal malignant neoplasm, This  ?                          is the patient's first colonoscopy ?                          No known family history of colon cancer or polyps ?Medicines:                Monitored Anesthesia Care ?Procedure:                Pre-Anesthesia Assessment: ?                          - Prior to the procedure, a History and Physical  ?                          was performed, and patient medications and  ?                          allergies were reviewed. The patient's tolerance of  ?                          previous anesthesia was also reviewed. The risks  ?                          and benefits of the procedure and the sedation  ?                          options and risks were discussed with the patient.  ?                          All questions were answered, and informed consent  ?                          was obtained. Prior Anticoagulants: The patient has  ?                          taken no previous anticoagulant or antiplatelet  ?                          agents. ASA Grade Assessment: II - A patient with  ?                          mild systemic disease. After reviewing the risks  ?                          and benefits, the patient was deemed in  ?  satisfactory condition to undergo the procedure. ?                          After obtaining informed consent, the colonoscope  ?                          was passed under direct vision. Throughout the  ?                          procedure, the patient's blood pressure, pulse, and  ?                          oxygen saturations were monitored continuously. The  ?                          CF [id number] #[id number]  was introduced through the anus  ?                          and advanced to the 8 cm into the ileum. A second  ?                          forward view of the right colon was performed. The  ?                          colonoscopy was performed without difficulty. The  ?                          patient tolerated the procedure well. The quality  ?                          of the bowel preparation was good. The terminal  ?                          ileum, ileocecal valve, appendiceal orifice, and  ?                          rectum were photographed. ?Scope In: 8:12:14 AM ?Scope Out: 8:26:14 AM ?Scope Withdrawal Time: 0 hours 11 minutes 57 seconds  ?Total Procedure Duration: 0 hours 14 minutes 0 seconds  ?Findings:                 The perianal and digital rectal examinations were  ?                          normal. ?                          Non-bleeding internal hemorrhoids were found. ?                          A few small-mouthed diverticula were found in the  ?                          sigmoid colon. ?  A 2 mm polyp was found in the rectum. The polyp was  ?                          sessile. The polyp was removed with a cold snare.  ?                          Resection and retrieval were complete. Estimated  ?                          blood loss was minimal. ?                          A 3 mm polyp was found in the transverse colon. The  ?                          polyp was sessile. The polyp was removed with a  ?                          cold snare. Resection and retrieval were complete.  ?                          Estimated blood loss was minimal. ?                          The remainder of the examined colonic mucosa  ?                          appeared normal. Biopsies were taken from the right  ?                          colon, transverse colon, left colon, and rectum  ?                          with a cold forceps for histology. Estimated blood  ?                          loss was  minimal. ?                          The terminal ileum contained a few ulcers. No  ?                          bleeding was present. The mucosa between ulcers  ?                          appeared normal. Biopsies were taken with a cold  ?                          forceps for histology. Estimated blood loss was  ?                          minimal. ?  The exam was otherwise without abnormality on  ?                          direct and retroflexion views. ?Complications:            No immediate complications. Estimated blood loss:  ?                          Minimal. ?Estimated Blood Loss:     Estimated blood loss was minimal. ?Impression:               - Non-bleeding internal hemorrhoids. ?                          - Diverticulosis in the sigmoid colon. ?                          - One 2 mm polyp in the rectum, removed with a cold  ?                          snare. Resected and retrieved. ?                          - One 3 mm polyp in the transverse colon, removed  ?                          with a cold snare. Resected and retrieved. ?                          - The entire examined colon is normal. Biopsied. ?                          - A few ulcers in the terminal ileum. Biopsied.  ?                          These may be due to NSAIDs but biopsies were  ?                          obtained to exclude Crohn's disease. ?                          - The examination was otherwise normal on direct  ?                          and retroflexion views. ?Recommendation:           - Patient has a contact number available for  ?                          emergencies. The signs and symptoms of potential  ?                          delayed complications were discussed with the  ?  patient. Return to normal activities tomorrow.  ?                          Written discharge instructions were provided to the  ?                          patient. ?                          - High fiber  diet. ?                          - Continue present medications. ?                          - Await pathology results. ?                          - No aspirin, ibuprofen, naproxen, or other  ?                          non-steroidal anti-inflammatory drugs as this may  ?                          be the cause of the small bowel ulcers. ?                          - Repeat colonoscopy date to be determined after  ?                          pending pathology results are reviewed for  ?                          surveillance. ?                          - Emerging evidence supports eating a diet of  ?                          fruits, vegetables, grains, calcium, and yogurt  ?                          while reducing red meat and alcohol may reduce the  ?                          risk of colon cancer. ?                          - Thank you for allowing me to be involved in your  ?                          colon cancer prevention. ?Tressia Danas MD, MD ?07/08/2021 8:33:16 AM ?This report has been signed electronically. ?

## 2021-07-08 NOTE — Telephone Encounter (Signed)
Per Dr. Orvan Falconer request, pt has been scheduled for first available follow up on 07/19/21 @ 10am with Alcide Evener, NP. Appt reminder has been mailed. Appt will reflect on AVS upon [facility] discharge for pt future reference. Also notified Dr. Orvan Falconer about this appt. States she will inform pt and [facility] staff to ensure pt is aware. ? ?

## 2021-07-08 NOTE — Progress Notes (Signed)
A and O x3. Report to RN. Tolerated MAC anesthesia well. 

## 2021-07-12 ENCOUNTER — Telehealth: Payer: Self-pay | Admitting: *Deleted

## 2021-07-12 ENCOUNTER — Telehealth: Payer: Self-pay

## 2021-07-12 NOTE — Telephone Encounter (Signed)
?  Follow up Call- ? ?Call back number 07/08/2021  ?Post procedure Call Back phone  # [phone]  ?Permission to leave phone message Yes  ?Some recent data might be hidden  ?  ?No answer at 2nd attempt follow up phone call.  Unable to leave a message d/t full VM ?

## 2021-07-12 NOTE — Telephone Encounter (Signed)
?  Follow up Call- ? ?Attempted to leave a message but mailbox was full ?

## 2021-07-18 ENCOUNTER — Ambulatory Visit: Payer: BC Managed Care – PPO

## 2021-07-18 ENCOUNTER — Ambulatory Visit
Admission: RE | Admit: 2021-07-18 | Discharge: 2021-07-18 | Disposition: A | Discharge status: Home / Self Care | Payer: BC Managed Care – PPO | Source: Ambulatory Visit | Attending: Physician Assistant | Admitting: Physician Assistant

## 2021-07-18 DIAGNOSIS — R922 Inconclusive mammogram: Secondary | ICD-10-CM | POA: Diagnosis not present

## 2021-07-18 DIAGNOSIS — R928 Other abnormal and inconclusive findings on diagnostic imaging of breast: Secondary | ICD-10-CM

## 2021-07-19 ENCOUNTER — Ambulatory Visit: Payer: BC Managed Care – PPO | Admitting: Nurse Practitioner

## 2021-07-29 DIAGNOSIS — N959 Unspecified menopausal and perimenopausal disorder: Secondary | ICD-10-CM | POA: Diagnosis not present

## 2021-08-03 ENCOUNTER — Other Ambulatory Visit: Payer: Self-pay

## 2021-08-04 ENCOUNTER — Encounter: Payer: Self-pay | Admitting: Nurse Practitioner

## 2021-08-04 ENCOUNTER — Ambulatory Visit (INDEPENDENT_AMBULATORY_CARE_PROVIDER_SITE_OTHER): Payer: BC Managed Care – PPO | Admitting: Nurse Practitioner

## 2021-08-04 DIAGNOSIS — Z86010 Personal history of colonic polyps: Secondary | ICD-10-CM | POA: Diagnosis not present

## 2021-08-04 NOTE — Progress Notes (Signed)
? ? ? ?08/04/2021 ?Emily [name] ?[id number] ?[birth year] ? ? ?Chief Complaint: Follow up after colonoscopy  ? ?History of Present Illness:  Emily [name] is a 46 year old female with a past medical history of attention deficit disorder, bipolar disorder and GERD.  She presents to our office today for follow-up after completing a screening colonoscopy. She underwent a colonoscopy by Dr. Orvan Falconer 07/08/2021 which identified a 74mm hyperplastic polyp removed from the rectum, a 53mm hyperplastic polyp from the transverse colon and a few ulcers were identified to the terminal ileum.  Biopsies of the TI were negative for Crohn's disease, likely due to NSAIDs.  She previously took ibuprofen 600 to 800 mg twice daily for 7 days monthly during her menstrual cycles.  She states she is now perimenopausal and her periods are not as painful.  She stopped taking NSAIDs following her colonoscopy and is using Tylenol as needed.  A repeat colonoscopy in 10 years was recommended.  She has a questions regarding the risks of any anal rectal problems with anal intercourse.  I recommended condom use to avoid STDs and to refrain from anal intercourse if she has  associated anorectal pain or bleeding. ? ?Past Medical History:  ?Diagnosis Date  ? ADHD   ? Bipolar 1 disorder (HCC)   ? GERD (gastroesophageal reflux disease)   ? H/O ULCER  ? HLD (hyperlipidemia) 05/25/2021  ? Seizures (HCC)   ? AS A CHILD,ONCE STARTED SCHHOL NO OTHER SEIZURE  ? Ulcer 25+ yrs ago  ? stress related  ? ? ? ? ?  Latest Ref Rng & Units 05/25/2021  ?  8:21 AM 07/13/2019  ?  9:29 PM  ?CBC  ?WBC 4.0 - 10.5 K/uL 12.8   17.1    ?Hemoglobin 12.0 - 15.0 g/dL 96.7   59.1    ?Hematocrit 36.0 - 46.0 % 41.3   44.4    ?Platelets 150.0 - 400.0 K/uL 478.0   478    ?  ? ?  Latest Ref Rng & Units 05/25/2021  ?  8:21 AM 07/13/2019  ?  9:29 PM  ?CMP  ?Glucose 70 - 99 mg/dL 89   638    ?BUN 6 - 23 mg/dL 15   9    ?Creatinine 0.40 - 1.20 mg/dL 4.66   5.99    ?Sodium 135 - 145 mEq/L 140    140    ?Potassium 3.5 - 5.1 mEq/L 3.9   3.8    ?Chloride 96 - 112 mEq/L 104   105    ?CO2 19 - 32 mEq/L 28   25    ?Calcium 8.4 - 10.5 mg/dL 9.6   9.5    ?Total Protein 6.0 - 8.3 g/dL 7.3   7.8    ?Total Bilirubin 0.2 - 1.2 mg/dL 0.4   0.7    ?Alkaline Phos 39 - 117 U/L 64   65    ?AST 0 - 37 U/L 12   14    ?ALT 0 - 35 U/L 14   14    ?  ? ?Colonoscopy 07/08/2021: ?- Non-bleeding internal hemorrhoids. ?- Diverticulosis in the sigmoid colon. ?- One 2 mm polyp in the rectum, removed with a cold snare. Resected and retrieved. ?- One 3 mm polyp in the transverse colon, removed with a cold snare. Resected and ?retrieved. ?- The entire examined colon is normal. Biopsied. ?- A few ulcers in the terminal ileum. Biopsied. These may be due to NSAIDs but biopsies ?were obtained to  exclude Crohn's disease. ?- The examination was otherwise normal on direct and retroflexion views. ?1. Surgical [P], colon, terminal ileum ulcer biopsies ?- SMALL INTESTINAL MUCOSA WITH NO SPECIFIC PATHOLOGIC DIAGNOSIS. ?2. Surgical [P], right colon biopsies ?- COLONIC MUCOSA WITH NO SPECIFIC PATHOLOGIC DIAGNOSIS. ?- NEGATIVE FOR ACTIVE COLITIS. ?- NEGATIVE FOR PATHOLOGIC INTRAEPITHELIAL LYMPHOCYTOSIS. ?3. Surgical [P], transverse colon biopsies ?- COLONIC MUCOSA WITH NO SPECIFIC PATHOLOGIC DIAGNOSIS. ?- NEGATIVE FOR ACTIVE COLITIS. ?- NEGATIVE FOR PATHOLOGIC INTRAEPITHELIAL LYMPHOCYTOSIS. ?4. Surgical [P], colon, transverse and rectum, polyp (2) ?- HYPERPLASTIC POLYPS (2). ?5. Surgical [P], left colon biopsies ?- COLONIC MUCOSA WITH NO SPECIFIC PATHOLOGIC DIAGNOSIS. ?- NEGATIVE FOR ACTIVE COLITIS. ?- NEGATIVE FOR PATHOLOGIC INTRAEPITHELIAL LYMPHOCYTOSIS. ?6. Surgical [P], colon, rectum biopsies ?- COLORECTAL MUCOSA WITH NO SPECIFIC PATHOLOGIC DIAGNOSIS. ?- NEGATIVE FOR ACTIVE PROCTITIS. ?- NEGATIVE FOR PATHOLOGIC INTRAEPITHELIAL LYMPHOCYTOSIS. ? ?Current Outpatient Medications on File Prior to Visit  ?Medication Sig Dispense Refill  ? acetaminophen  (TYLENOL) 500 MG tablet Take 1,000 mg by mouth as needed.    ? amphetamine-dextroamphetamine (ADDERALL) 10 MG tablet 10 mg.    ? ?No current facility-administered medications on file prior to visit.  ? ?No Known Allergies ? ? ?Current Medications, Allergies, Past Medical History, Past Surgical History, Family History and Social History were reviewed in [employer] record. ? ? ?Review of Systems:   ?Constitutional: Negative for fever, sweats, chills or weight loss.  ?Respiratory: Negative for shortness of breath.   ?Cardiovascular: Negative for chest pain, palpitations and leg swelling.  ?Gastrointestinal: See HPI.  ?Musculoskeletal: Negative for back pain or muscle aches.  ?Neurological: Negative for dizziness, headaches or paresthesias.  ? ? ?Physical Exam: ?BP 120/64 (BP Location: Left Arm, Patient Position: Sitting, Cuff Size: Normal)   Pulse 80   Ht 5\' 3"  (1.6 m) Comment: height measured without shoes  Wt 178 lb 6 oz (80.9 kg)   BMI 31.60 kg/m?  ?General: 46 year old female in no acute distress. ?Head: Normocephalic and atraumatic. ?Eyes: No scleral icterus. Conjunctiva pink . ?Ears: Normal auditory acuity. ?Lungs: Clear throughout to auscultation. ?Heart: Regular rate and rhythm, no murmur. ?Abdomen: Soft, nontender and nondistended. No masses or hepatomegaly. Normal bowel sounds x 4 quadrants.  ?Musculoskeletal: Symmetrical with no gross deformities. ?Extremities: No edema. ?Neurological: Alert oriented x 4. No focal deficits.  ?Psychological: Alert and cooperative. Normal mood and affect ? ?Assessment and Recommendations: ? ?58) 46 year old female with hyperplastic rectal and transverse colon polyps and ulcers at the TI likely due to NSAID use, no evidence of Crohn's disease per colonoscopy 07/08/2021. ?-Recall colonoscopy 06/2031, earlier if symptoms warrant ?-Follow-up as needed ? ?2) No NSAIDs ? ?

## 2021-08-04 NOTE — Patient Instructions (Signed)
?  1) Next colonoscopy due March 2033 ? ?2) Avoid NSAID use ? ?3) Follow up as needed  ?

## 2021-08-05 NOTE — Progress Notes (Signed)
Reviewed and agree with management plans. ? ?Emily [name] L. Emily Fennema, MD, MPH  ?
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
## 2022-03-03 ENCOUNTER — Ambulatory Visit (INDEPENDENT_AMBULATORY_CARE_PROVIDER_SITE_OTHER)
Admission: RE | Admit: 2022-03-03 | Discharge: 2022-03-03 | Disposition: A | Discharge status: Home / Self Care | Payer: Managed Care, Other (non HMO) | Source: Ambulatory Visit | Attending: Physician Assistant

## 2022-03-03 ENCOUNTER — Ambulatory Visit: Payer: Managed Care, Other (non HMO) | Admitting: Physician Assistant

## 2022-03-03 ENCOUNTER — Encounter: Payer: Self-pay | Admitting: Physician Assistant

## 2022-03-03 VITALS — BP 132/88 | HR 79 | Temp 98.2°F | Ht 63.0 in | Wt 176.4 lb

## 2022-03-03 DIAGNOSIS — R030 Elevated blood-pressure reading, without diagnosis of hypertension: Secondary | ICD-10-CM | POA: Diagnosis not present

## 2022-03-03 DIAGNOSIS — M545 Low back pain, unspecified: Secondary | ICD-10-CM

## 2022-03-03 MED ORDER — GABAPENTIN 100 MG PO CAPS: 100.0000 mg | ORAL_CAPSULE | Freq: Three times a day (TID) | ORAL | 3 refills | Status: DC

## 2022-03-03 MED ORDER — TRAZODONE HCL 50 MG PO TABS: 50.0000 mg | ORAL_TABLET | Freq: Every day | ORAL | 0 refills | Status: DC

## 2022-03-03 MED ORDER — ACETAMINOPHEN-CODEINE 300-30 MG PO TABS: 1.0000 | ORAL_TABLET | Freq: Three times a day (TID) | ORAL | 0 refills | Status: DC | PRN

## 2022-03-03 NOTE — Patient Instructions (Signed)
It was great to see you!  Start gabapentin 100 mg three times daily If needed, may increase by 100 mg to a total of 300 mg at nightly dosing  May take tylenol #3 for severe pain  An order for an xray has been put in for you. To get your xray, you can walk in at the [facility] location without a scheduled appointment.  The address is [address]. It is across the street from [facility] is located in the basement.  Hours of operation are M-F 8:30am to 5:00pm. Please note that they are closed for lunch between 12:30 and 1:00pm.  Sports medicine referral placed to figure out what's going on  IF any worsening symptoms, go to the ER over the weeked  Take care,  Inda Coke PA-C

## 2022-03-06 ENCOUNTER — Other Ambulatory Visit: Payer: Self-pay | Admitting: Physician Assistant

## 2022-03-06 ENCOUNTER — Other Ambulatory Visit: Payer: Self-pay | Admitting: *Deleted

## 2022-03-06 LAB — URINE CULTURE
MICRO NUMBER:: 14141641
SPECIMEN QUALITY:: ADEQUATE

## 2022-03-06 MED ORDER — CEPHALEXIN 500 MG PO CAPS
500.0000 mg | ORAL_CAPSULE | Freq: Two times a day (BID) | ORAL | 0 refills | Status: DC
Start: 2022-03-06 — End: 2022-05-04

## 2022-03-06 MED ORDER — FLUCONAZOLE 150 MG PO TABS: 150.0000 mg | ORAL_TABLET | Freq: Once | ORAL | 0 refills | Status: AC

## 2022-03-07 ENCOUNTER — Telehealth: Payer: Self-pay | Admitting: Physician Assistant

## 2022-03-07 NOTE — Telephone Encounter (Signed)
Pt called back told her the referral for Sports Medicine was done and they did leave you a message to call to schedule. Here is there number [phone]. Pt verbalized understanding.

## 2022-03-07 NOTE — Telephone Encounter (Signed)
Left message on voicemail to call office.  

## 2022-03-07 NOTE — Telephone Encounter (Signed)
Patient states she has agreed to Physical Therapy and to go ahead with the Referral

## 2022-03-10 ENCOUNTER — Ambulatory Visit: Payer: Managed Care, Other (non HMO) | Admitting: Sports Medicine

## 2022-03-10 NOTE — Progress Notes (Unsigned)
    [name] [name] [address] [place] [address] Phone: [phone]   Assessment and Plan:     There are no diagnoses linked to this encounter.  ***   Pertinent previous records reviewed include ***   Follow Up: ***     Subjective:   I, Emily [name], am serving as a Neurosurgeon for Doctor Emily [name]  Chief Complaint: low back pain   HPI:   03/13/2022 Patient is a 46 year old female complaining of low back pain. Patient states  Relevant Historical Information: ***  Additional pertinent review of systems negative.   Current Outpatient Medications:    acetaminophen (TYLENOL) 500 MG tablet, Take 1,000 mg by mouth as needed., Disp: , Rfl:    acetaminophen-codeine (TYLENOL #3) 300-30 MG tablet, Take 1-2 tablets by mouth every 8 (eight) hours as needed for severe pain., Disp: 20 tablet, Rfl: 0   amphetamine-dextroamphetamine (ADDERALL) 10 MG tablet, 10 mg., Disp: , Rfl:    cephALEXin (KEFLEX) 500 MG capsule, Take 1 capsule (500 mg total) by mouth 2 (two) times daily., Disp: 14 capsule, Rfl: 0   gabapentin (NEURONTIN) 100 MG capsule, Take 1 capsule (100 mg total) by mouth 3 (three) times daily., Disp: 90 capsule, Rfl: 3   traZODone (DESYREL) 50 MG tablet, Take 1 tablet (50 mg total) by mouth at bedtime., Disp: 90 tablet, Rfl: 0   Objective:     There were no vitals filed for this visit.    There is no height or weight on file to calculate BMI.    Physical Exam:    ***   Electronically signed by:  [name] [name] 11:47 AM 03/10/22

## 2022-03-13 ENCOUNTER — Ambulatory Visit: Payer: Managed Care, Other (non HMO) | Admitting: Sports Medicine

## 2022-03-13 VITALS — BP 122/68 | HR 85 | Ht 63.0 in | Wt 179.0 lb

## 2022-03-13 DIAGNOSIS — G8929 Other chronic pain: Secondary | ICD-10-CM | POA: Diagnosis not present

## 2022-03-13 DIAGNOSIS — M5441 Lumbago with sciatica, right side: Secondary | ICD-10-CM

## 2022-03-13 DIAGNOSIS — M5431 Sciatica, right side: Secondary | ICD-10-CM | POA: Diagnosis not present

## 2022-03-13 MED ORDER — METHYLPREDNISOLONE 4 MG PO TBPK: ORAL_TABLET | ORAL | 0 refills | Status: DC

## 2022-03-13 MED ORDER — GABAPENTIN 300 MG PO CAPS: 300.0000 mg | ORAL_CAPSULE | Freq: Three times a day (TID) | ORAL | 0 refills | Status: DC

## 2022-03-13 NOTE — Patient Instructions (Addendum)
Good to see you  Prednisone dos pak  Low back HEP Lumbar Mri  Follow up 3 days after MRI to discuss results

## 2022-03-22 ENCOUNTER — Other Ambulatory Visit: Payer: Self-pay | Admitting: Sports Medicine

## 2022-04-17 ENCOUNTER — Other Ambulatory Visit: Payer: Self-pay | Admitting: Sports Medicine

## 2022-04-17 ENCOUNTER — Telehealth: Payer: Self-pay | Admitting: Sports Medicine

## 2022-04-17 ENCOUNTER — Telehealth: Payer: Self-pay | Admitting: Physician Assistant

## 2022-04-17 MED ORDER — GABAPENTIN 300 MG PO CAPS: 300.0000 mg | ORAL_CAPSULE | Freq: Three times a day (TID) | ORAL | 0 refills | Status: DC

## 2022-04-17 NOTE — Telephone Encounter (Signed)
Patient called requesting a refill on gabapentin (NEURONTIN) 300 MG capsule to be sent to [facility] on [address].

## 2022-04-17 NOTE — Telephone Encounter (Signed)
Patient states she is taking 2 Trazodone 50 mg tablets at night.  Patient states one tablet does not do anything.  Patient requests RX for Trazodone 100 mg tablets to be sent to:  [id number] [place], [place] - [address] AT [facility] [address] [facility] [address] [facility] [address] Phone: [phone]  Fax: [phone]      Patient states she will be out of the above medication by 04/19/22  Also, Patient states PT Doctor is taking over Gabapentin (Neurontin)

## 2022-04-17 NOTE — Telephone Encounter (Signed)
Gabapentin 300 mg was refilled and sent to [address]

## 2022-04-17 NOTE — Telephone Encounter (Signed)
Please see message and advise if okay to send in Trazodone 100 mg?

## 2022-04-18 MED ORDER — TRAZODONE HCL 100 MG PO TABS: 100.0000 mg | ORAL_TABLET | Freq: Every day | ORAL | 0 refills | Status: DC

## 2022-04-18 NOTE — Telephone Encounter (Signed)
Patient returned call. Requests to be called before 2 pm today if possible. States if unable to answer call, please leave a detailed message on voice if after 2 pm 04/18/22.

## 2022-04-18 NOTE — Telephone Encounter (Signed)
Spoke to pt told her Rx for Trazodone 100 mg was sent to the pharmacy. Pt verbalized understanding.

## 2022-04-18 NOTE — Addendum Note (Signed)
Addended by: Jimmye Norman on: 04/18/2022 09:16 AM   Modules accepted: Orders

## 2022-04-18 NOTE — Telephone Encounter (Signed)
Left message on voicemail to call office.  
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
## 2022-04-21 ENCOUNTER — Ambulatory Visit: Payer: Managed Care, Other (non HMO) | Admitting: Sports Medicine

## 2022-04-21 VITALS — HR 90 | Ht 63.0 in | Wt 179.0 lb

## 2022-04-21 DIAGNOSIS — M5431 Sciatica, right side: Secondary | ICD-10-CM

## 2022-04-21 DIAGNOSIS — G8929 Other chronic pain: Secondary | ICD-10-CM

## 2022-04-21 DIAGNOSIS — M5441 Lumbago with sciatica, right side: Secondary | ICD-10-CM | POA: Diagnosis not present

## 2022-04-21 NOTE — Patient Instructions (Addendum)
Good to see you   

## 2022-04-28 ENCOUNTER — Ambulatory Visit
Admission: RE | Admit: 2022-04-28 | Discharge: 2022-04-28 | Disposition: A | Discharge status: Home / Self Care | Payer: Managed Care, Other (non HMO) | Source: Ambulatory Visit | Attending: Sports Medicine | Admitting: Sports Medicine

## 2022-04-28 ENCOUNTER — Other Ambulatory Visit: Payer: Self-pay | Admitting: Sports Medicine

## 2022-04-28 DIAGNOSIS — G8929 Other chronic pain: Secondary | ICD-10-CM
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
## 2022-05-04 ENCOUNTER — Ambulatory Visit: Payer: BC Managed Care – PPO | Admitting: Sports Medicine

## 2022-05-04 VITALS — BP 132/80 | HR 92 | Ht 63.0 in | Wt 179.0 lb

## 2022-05-04 DIAGNOSIS — G8929 Other chronic pain: Secondary | ICD-10-CM | POA: Diagnosis not present

## 2022-05-04 DIAGNOSIS — M5441 Lumbago with sciatica, right side: Secondary | ICD-10-CM

## 2022-05-04 DIAGNOSIS — M5431 Sciatica, right side: Secondary | ICD-10-CM

## 2022-05-04 LAB — CBC WITH DIFFERENTIAL/PLATELET
Basophils Absolute: 0.1 10*3/uL (ref 0.0–0.1)
Basophils Relative: 0.5 % (ref 0.0–3.0)
Eosinophils Absolute: 0.3 10*3/uL (ref 0.0–0.7)
Eosinophils Relative: 2.3 % (ref 0.0–5.0)
HCT: 41.9 % (ref 36.0–46.0)
Hemoglobin: 13.7 g/dL (ref 12.0–15.0)
Lymphocytes Relative: 33.3 % (ref 12.0–46.0)
Lymphs Abs: 4.7 10*3/uL — ABNORMAL HIGH (ref 0.7–4.0)
MCHC: 32.6 g/dL (ref 30.0–36.0)
MCV: 90.6 fl (ref 78.0–100.0)
Monocytes Absolute: 0.7 10*3/uL (ref 0.1–1.0)
Monocytes Relative: 5 % (ref 3.0–12.0)
Neutro Abs: 8.3 10*3/uL — ABNORMAL HIGH (ref 1.4–7.7)
Neutrophils Relative %: 58.9 % (ref 43.0–77.0)
Platelets: 530 10*3/uL — ABNORMAL HIGH (ref 150.0–400.0)
RBC: 4.63 Mil/uL (ref 3.87–5.11)
RDW: 14.9 % (ref 11.5–15.5)
WBC: 14.2 10*3/uL — ABNORMAL HIGH (ref 4.0–10.5)

## 2022-05-04 LAB — URIC ACID: Uric Acid, Serum: 3.7 mg/dL (ref 2.4–7.0)

## 2022-05-04 LAB — COMPREHENSIVE METABOLIC PANEL
ALT: 13 U/L (ref 0–35)
AST: 15 U/L (ref 0–37)
Albumin: 4.2 g/dL (ref 3.5–5.2)
Alkaline Phosphatase: 82 U/L (ref 39–117)
BUN: 11 mg/dL (ref 6–23)
CO2: 30 mEq/L (ref 19–32)
Calcium: 9.9 mg/dL (ref 8.4–10.5)
Chloride: 101 mEq/L (ref 96–112)
Creatinine, Ser: 0.65 mg/dL (ref 0.40–1.20)
GFR: 104.85 mL/min (ref >60.00)
Glucose, Bld: 96 mg/dL (ref 70–99)
Potassium: 4 mEq/L (ref 3.5–5.1)
Sodium: 138 mEq/L (ref 135–145)
Total Bilirubin: 0.2 mg/dL (ref 0.2–1.2)
Total Protein: 7.2 g/dL (ref 6.0–8.3)

## 2022-05-04 LAB — VITAMIN D 25 HYDROXY (VIT D DEFICIENCY, FRACTURES): VITD: 41.4 ng/mL (ref 30.00–100.00)

## 2022-05-04 LAB — FERRITIN: Ferritin: 31.1 ng/mL (ref 10.0–291.0)

## 2022-05-04 LAB — TSH: TSH: 1.16 u[IU]/mL (ref 0.35–5.50)

## 2022-05-04 LAB — C-REACTIVE PROTEIN: CRP: 1.2 mg/dL (ref 0.5–20.0)

## 2022-05-04 LAB — SEDIMENTATION RATE: Sed Rate: 76 mm/hr — ABNORMAL HIGH (ref 0–20)

## 2022-05-04 MED ORDER — GABAPENTIN 300 MG PO CAPS: 300.0000 mg | ORAL_CAPSULE | Freq: Three times a day (TID) | ORAL | 0 refills | Status: DC

## 2022-05-04 MED ORDER — CYCLOBENZAPRINE HCL 5 MG PO TABS: 5.0000 mg | ORAL_TABLET | Freq: Every day | ORAL | 0 refills | Status: DC

## 2022-05-04 NOTE — Patient Instructions (Addendum)
Work note  Labs on the way out  Gabapentin refill Flexeril 5-10 mg nightly as needed for muscle spasm  Epidural referral  Follow up 2 weeks after epidural to discuss results

## 2022-05-06 LAB — RHEUMATOID FACTOR: Rheumatoid fact SerPl-aCnc: <14 IU/mL (ref <14)

## 2022-05-06 LAB — ANA: Anti Nuclear Antibody (ANA): NEGATIVE

## 2022-05-06 LAB — CYCLIC CITRUL PEPTIDE ANTIBODY, IGG: Cyclic Citrullin Peptide Ab: <16 UNITS

## 2022-05-12 ENCOUNTER — Ambulatory Visit
Admission: RE | Admit: 2022-05-12 | Discharge: 2022-05-12 | Disposition: A | Discharge status: Home / Self Care | Payer: BC Managed Care – PPO | Source: Ambulatory Visit | Attending: Sports Medicine | Admitting: Sports Medicine

## 2022-05-12 DIAGNOSIS — M5126 Other intervertebral disc displacement, lumbar region: Secondary | ICD-10-CM | POA: Diagnosis not present

## 2022-05-12 DIAGNOSIS — G8929 Other chronic pain: Secondary | ICD-10-CM

## 2022-05-12 DIAGNOSIS — M47817 Spondylosis without myelopathy or radiculopathy, lumbosacral region: Secondary | ICD-10-CM | POA: Diagnosis not present

## 2022-05-12 DIAGNOSIS — M5431 Sciatica, right side: Secondary | ICD-10-CM

## 2022-05-12 MED ORDER — IOPAMIDOL (ISOVUE-M 200) INJECTION 41%
1.0000 mL | Freq: Once | INTRAMUSCULAR | Status: AC
  Administered 2022-05-12: 1 mL via EPIDURAL

## 2022-05-12 MED ORDER — METHYLPREDNISOLONE ACETATE 40 MG/ML INJ SUSP (RADIOLOG
80.0000 mg | Freq: Once | INTRAMUSCULAR | Status: AC
  Administered 2022-05-12: 80 mg via EPIDURAL

## 2022-05-12 NOTE — Discharge Instructions (Signed)

## 2022-05-26 ENCOUNTER — Other Ambulatory Visit: Payer: BC Managed Care – PPO
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
## 2022-05-29 ENCOUNTER — Other Ambulatory Visit: Payer: Self-pay | Admitting: Physician Assistant

## 2022-05-29 ENCOUNTER — Ambulatory Visit (INDEPENDENT_AMBULATORY_CARE_PROVIDER_SITE_OTHER): Payer: BC Managed Care – PPO | Admitting: Sports Medicine

## 2022-05-29 VITALS — BP 122/81 | HR 85 | Ht 63.0 in | Wt 184.0 lb

## 2022-05-29 DIAGNOSIS — G8929 Other chronic pain: Secondary | ICD-10-CM | POA: Diagnosis not present

## 2022-05-29 DIAGNOSIS — M5441 Lumbago with sciatica, right side: Secondary | ICD-10-CM | POA: Diagnosis not present

## 2022-05-29 DIAGNOSIS — M7918 Myalgia, other site: Secondary | ICD-10-CM | POA: Diagnosis not present

## 2022-05-29 DIAGNOSIS — M546 Pain in thoracic spine: Secondary | ICD-10-CM | POA: Diagnosis not present

## 2022-05-29 DIAGNOSIS — M5431 Sciatica, right side: Secondary | ICD-10-CM

## 2022-05-29 NOTE — Patient Instructions (Addendum)
Good to see you  PT referral [facility]  4-6 week follow up

## 2022-06-01 ENCOUNTER — Other Ambulatory Visit: Payer: Self-pay | Admitting: Sports Medicine

## 2022-06-02 MED ORDER — CYCLOBENZAPRINE HCL 5 MG PO TABS: 5.0000 mg | ORAL_TABLET | Freq: Every day | ORAL | 0 refills | Status: DC

## 2022-06-02 MED ORDER — GABAPENTIN 300 MG PO CAPS: 300.0000 mg | ORAL_CAPSULE | Freq: Three times a day (TID) | ORAL | 0 refills | Status: DC

## 2022-07-03 ENCOUNTER — Other Ambulatory Visit: Payer: Self-pay | Admitting: Sports Medicine

## 2022-07-03 ENCOUNTER — Other Ambulatory Visit: Payer: Self-pay | Admitting: Physician Assistant

## 2022-07-03 MED ORDER — TRAZODONE HCL 100 MG PO TABS: 100.0000 mg | ORAL_TABLET | Freq: Every day | ORAL | 0 refills | Status: DC

## 2022-07-03 MED ORDER — GABAPENTIN 300 MG PO CAPS: 300.0000 mg | ORAL_CAPSULE | Freq: Three times a day (TID) | ORAL | 0 refills | Status: DC

## 2022-07-03 MED ORDER — CYCLOBENZAPRINE HCL 5 MG PO TABS: 5.0000 mg | ORAL_TABLET | Freq: Every day | ORAL | 0 refills | Status: DC

## 2022-07-06 DIAGNOSIS — Z6832 Body mass index (BMI) 32.0-32.9, adult: Secondary | ICD-10-CM | POA: Diagnosis not present

## 2022-07-06 DIAGNOSIS — F909 Attention-deficit hyperactivity disorder, unspecified type: Secondary | ICD-10-CM | POA: Diagnosis not present

## 2022-07-06 DIAGNOSIS — N951 Menopausal and female climacteric states: Secondary | ICD-10-CM | POA: Diagnosis not present

## 2022-07-06 DIAGNOSIS — Z124 Encounter for screening for malignant neoplasm of cervix: Secondary | ICD-10-CM | POA: Diagnosis not present

## 2022-07-06 DIAGNOSIS — Z01419 Encounter for gynecological examination (general) (routine) without abnormal findings: Secondary | ICD-10-CM | POA: Diagnosis not present

## 2022-07-06 DIAGNOSIS — R895 Abnormal microbiological findings in specimens from other organs, systems and tissues: Secondary | ICD-10-CM | POA: Diagnosis not present

## 2022-07-06 DIAGNOSIS — Z113 Encounter for screening for infections with a predominantly sexual mode of transmission: Secondary | ICD-10-CM | POA: Diagnosis not present

## 2022-07-17 ENCOUNTER — Other Ambulatory Visit: Payer: Self-pay | Admitting: Physician Assistant

## 2022-08-03 DIAGNOSIS — N870 Mild cervical dysplasia: Secondary | ICD-10-CM | POA: Diagnosis not present

## 2022-08-04 ENCOUNTER — Other Ambulatory Visit: Payer: Self-pay | Admitting: Physician Assistant

## 2022-08-04 ENCOUNTER — Other Ambulatory Visit: Payer: Self-pay | Admitting: Sports Medicine

## 2022-08-04 DIAGNOSIS — R87610 Atypical squamous cells of undetermined significance on cytologic smear of cervix (ASC-US): Secondary | ICD-10-CM | POA: Diagnosis not present

## 2022-08-04 DIAGNOSIS — Z1231 Encounter for screening mammogram for malignant neoplasm of breast: Secondary | ICD-10-CM | POA: Diagnosis not present

## 2022-08-04 LAB — HM MAMMOGRAPHY

## 2022-08-04 MED ORDER — TRAZODONE HCL 100 MG PO TABS: 100.0000 mg | ORAL_TABLET | Freq: Every day | ORAL | 0 refills | Status: DC

## 2022-08-04 MED ORDER — CYCLOBENZAPRINE HCL 5 MG PO TABS: 5.0000 mg | ORAL_TABLET | Freq: Every day | ORAL | 0 refills | Status: DC

## 2022-08-04 MED ORDER — GABAPENTIN 300 MG PO CAPS: 300.0000 mg | ORAL_CAPSULE | Freq: Three times a day (TID) | ORAL | 0 refills | Status: DC

## 2022-08-08 ENCOUNTER — Other Ambulatory Visit: Payer: Self-pay | Admitting: Obstetrics and Gynecology

## 2022-08-08 DIAGNOSIS — R928 Other abnormal and inconclusive findings on diagnostic imaging of breast: Secondary | ICD-10-CM

## 2022-08-23 DIAGNOSIS — R87612 Low grade squamous intraepithelial lesion on cytologic smear of cervix (LGSIL): Secondary | ICD-10-CM | POA: Diagnosis not present

## 2022-08-23 DIAGNOSIS — N760 Acute vaginitis: Secondary | ICD-10-CM | POA: Diagnosis not present

## 2022-09-03 ENCOUNTER — Other Ambulatory Visit: Payer: Self-pay | Admitting: Sports Medicine

## 2022-10-05 ENCOUNTER — Other Ambulatory Visit: Payer: Self-pay | Admitting: Sports Medicine

## 2022-10-19 ENCOUNTER — Other Ambulatory Visit: Payer: Self-pay | Admitting: Physician Assistant

## 2022-10-19 ENCOUNTER — Other Ambulatory Visit: Payer: Self-pay | Admitting: Sports Medicine

## 2022-10-20 ENCOUNTER — Other Ambulatory Visit: Payer: Self-pay | Admitting: Sports Medicine

## 2022-10-20 MED ORDER — GABAPENTIN 300 MG PO CAPS: 300.0000 mg | ORAL_CAPSULE | Freq: Two times a day (BID) | ORAL | 0 refills | Status: DC

## 2022-10-20 MED ORDER — TRAZODONE HCL 100 MG PO TABS: 100.0000 mg | ORAL_TABLET | Freq: Every day | ORAL | 0 refills | Status: DC

## 2022-12-01 ENCOUNTER — Other Ambulatory Visit: Payer: Self-pay | Admitting: Sports Medicine

## 2023-01-03 DIAGNOSIS — F909 Attention-deficit hyperactivity disorder, unspecified type: Secondary | ICD-10-CM | POA: Diagnosis not present

## 2023-01-03 DIAGNOSIS — N951 Menopausal and female climacteric states: Secondary | ICD-10-CM | POA: Diagnosis not present

## 2023-01-03 DIAGNOSIS — R87612 Low grade squamous intraepithelial lesion on cytologic smear of cervix (LGSIL): Secondary | ICD-10-CM | POA: Diagnosis not present

## 2023-01-05 ENCOUNTER — Other Ambulatory Visit: Payer: Self-pay | Admitting: Sports Medicine

## 2023-01-08 LAB — HM PAP SMEAR
HM Pap smear: ABNORMAL
HPV, high-risk: NOT DETECTED

## 2023-02-08 ENCOUNTER — Other Ambulatory Visit: Payer: Self-pay | Admitting: Physician Assistant

## 2023-02-08 ENCOUNTER — Other Ambulatory Visit: Payer: Self-pay | Admitting: Sports Medicine

## 2023-02-08 MED ORDER — GABAPENTIN 300 MG PO CAPS: 300.0000 mg | ORAL_CAPSULE | Freq: Three times a day (TID) | ORAL | 0 refills | Status: DC

## 2023-02-17 ENCOUNTER — Other Ambulatory Visit: Payer: Self-pay | Admitting: Physician Assistant

## 2023-03-11 ENCOUNTER — Other Ambulatory Visit: Payer: Self-pay | Admitting: Sports Medicine

## 2023-03-12 ENCOUNTER — Encounter: Payer: Self-pay | Admitting: Sports Medicine

## 2023-03-12 MED ORDER — GABAPENTIN 300 MG PO CAPS: 300.0000 mg | ORAL_CAPSULE | Freq: Three times a day (TID) | ORAL | 2 refills | Status: DC

## 2023-03-18 ENCOUNTER — Other Ambulatory Visit: Payer: Self-pay | Admitting: Physician Assistant

## 2023-05-03 ENCOUNTER — Encounter: Payer: Self-pay | Admitting: Physician Assistant

## 2023-05-03 ENCOUNTER — Other Ambulatory Visit: Payer: Self-pay | Admitting: Physician Assistant

## 2023-05-04 MED ORDER — TRAZODONE HCL 100 MG PO TABS: 100.0000 mg | ORAL_TABLET | Freq: Every day | ORAL | 0 refills | Status: DC

## 2023-05-22 ENCOUNTER — Ambulatory Visit: Payer: Self-pay | Admitting: Physician Assistant

## 2023-05-22 NOTE — Telephone Encounter (Signed)
Copied from CRM [id number]. Topic: Clinical - Red Word Triage >> May 22, 2023 12:03 PM Samuel Jester B wrote: Red Word that prompted transfer to Nurse Triage: Pt stated that she has been feeling sick for 3 weeks. Pt has a fever,chills, stuffy nose, sore throat, headache, loss of breath, severe nausea, and  loss of appetite, pt stated that she has loss 10 pounds in the last weeks.   Chief Complaint: Fever off/on x 3 weeks Symptoms: ,chills, stuffy nose, sore throat, headache, and  loss of appetite Frequency: constant Pertinent Negatives: Patient denies recent travel Disposition: [] ED /[] Urgent Care (no appt availability in office) / [x] Appointment(In office/virtual)/ []  [facility]/ [] Home Care/ [] Refused Recommended Disposition /[] [facility]/ []  Follow-up with PCP Additional Notes: Patient states that symptoms have persisted for 3 weeks, she has not taken any meds because she didn't know how it would interact with her Gabapentin. Pt states that her appetite is poor and at night her symptoms are worse. Appt scheduled for 01/22 Reason for Disposition  [1] Fever comes and goes (intermittent) AND [2] lasts > 3 weeks  Answer Assessment - Initial Assessment Questions 1. TEMPERATURE: "What is the most recent temperature?"  "How was it measured?"      99.78F  2. ONSET: "When did the fever start?"      Started 3 weeks ago  3. CHILLS: "Do you have chills?" If yes: "How bad are they?"  (e.g., none, mild, moderate, severe)   - NONE: no chills   - MILD: feeling cold   - MODERATE: feeling very cold, some shivering (feels better under a thick blanket)   - SEVERE: feeling extremely cold with shaking chills (general body shaking, rigors; even under a thick blanket)      Moderate  4. OTHER SYMPTOMS: "Do you have any other symptoms besides the fever?"  (e.g., abdomen pain, cough, diarrhea, earache, headache, sore throat, urination pain)     Fever,chills, stuffy nose, sore throat, headache,  loss of breath, severe nausea, and  loss of appetite, pt stated that she has loss 10 pounds in the last weeks.  5. CAUSE: If there are no symptoms, ask: "What do you think is causing the fever?"     Unsure of cause  6. CONTACTS: "Does anyone else in the family have an infection?"     Works in healthcare  7. TREATMENT: "What have you done so far to treat this fever?" (e.g., medications) Take suboxone and gabapentin, so doesn't want anything to interact with medication  8. IMMUNOCOMPROMISE: "Do you have of the following: diabetes, HIV positive, splenectomy, cancer chemotherapy, chronic steroid treatment, transplant patient, etc."     No  9. PREGNANCY: "Is there any chance you are pregnant?" "When was your last menstrual period?"     No  10. TRAVEL: "Have you traveled out of the country in the last month?" (e.g., travel history, exposures)       No  Protocols used: [facility]

## 2023-05-22 NOTE — Telephone Encounter (Signed)
FYI, pt scheduled to see Dr. Jon Billings on 1/22.

## 2023-05-23 ENCOUNTER — Encounter: Payer: Self-pay | Admitting: Internal Medicine

## 2023-05-23 ENCOUNTER — Encounter: Payer: Self-pay | Admitting: Physician Assistant

## 2023-05-23 ENCOUNTER — Ambulatory Visit: Payer: BC Managed Care – PPO | Admitting: Internal Medicine

## 2023-05-23 VITALS — BP 139/84 | HR 96 | Temp 97.2°F | Wt 173.4 lb

## 2023-05-23 DIAGNOSIS — R6889 Other general symptoms and signs: Secondary | ICD-10-CM

## 2023-05-23 DIAGNOSIS — J320 Chronic maxillary sinusitis: Secondary | ICD-10-CM | POA: Diagnosis not present

## 2023-05-23 DIAGNOSIS — R52 Pain, unspecified: Secondary | ICD-10-CM

## 2023-05-23 DIAGNOSIS — R051 Acute cough: Secondary | ICD-10-CM

## 2023-05-23 DIAGNOSIS — R509 Fever, unspecified: Secondary | ICD-10-CM

## 2023-05-23 DIAGNOSIS — J209 Acute bronchitis, unspecified: Secondary | ICD-10-CM

## 2023-05-23 DIAGNOSIS — J440 Chronic obstructive pulmonary disease with acute lower respiratory infection: Secondary | ICD-10-CM | POA: Diagnosis not present

## 2023-05-23 DIAGNOSIS — H6611 Chronic tubotympanic suppurative otitis media, right ear: Secondary | ICD-10-CM

## 2023-05-23 LAB — POCT INFLUENZA A/B
Influenza A, POC: NEGATIVE
Influenza B, POC: NEGATIVE

## 2023-05-23 LAB — POC COVID19 BINAXNOW: SARS Coronavirus 2 Ag: NEGATIVE

## 2023-05-23 MED ORDER — PSEUDOEPHEDRINE HCL ER 120 MG PO TB12: 120.0000 mg | ORAL_TABLET | Freq: Two times a day (BID) | ORAL | 0 refills | Status: DC

## 2023-05-23 MED ORDER — SIMPLY SALINE 0.9 % NA AERS: 2.0000 | INHALATION_SPRAY | NASAL | 11 refills | Status: DC

## 2023-05-23 MED ORDER — AMOXICILLIN-POT CLAVULANATE 875-125 MG PO TABS: 1.0000 | ORAL_TABLET | Freq: Two times a day (BID) | ORAL | 0 refills | Status: DC

## 2023-05-23 MED ORDER — LORATADINE 10 MG PO TABS: 10.0000 mg | ORAL_TABLET | Freq: Every day | ORAL | 11 refills | Status: DC

## 2023-05-23 MED ORDER — FLUTICASONE PROPIONATE 50 MCG/ACT NA SUSP: 2.0000 | Freq: Every day | NASAL | 6 refills | Status: DC

## 2023-05-23 NOTE — Patient Instructions (Addendum)
VISIT SUMMARY:  You came in today with persistent symptoms of a severe sinus infection, including congestion, cough, and breathing difficulties, despite previous treatment. You also reported chest pain, neck pain, jaw pain, and nausea, which you initially thought were due to anxiety and possible vitamin overdose. Additionally, you have been experiencing back pain, difficulty sleeping, and an itchy ear. You are a cigar smoker, which may be complicating your symptoms.  YOUR PLAN:  -SEVERE SINUS INFECTION: A severe sinus infection is an inflammation of the sinuses often caused by bacteria, leading to congestion, cough, and breathing difficulties. You will be prescribed Augmentin for 10 days and should use Simply Saline nasal spray for sinus rinses. You can also use Sudafed or Flonase as needed. Avoid smoking to help your recovery.  -BACTERIAL EAR INFECTION: A bacterial ear infection is an infection in the ear caused by bacteria, often related to a sinus infection. Your treatment with Augmentin and sinus rinses will also help clear this infection.  -POSSIBLE LOWER RESPIRATORY TRACT INVOLVEMENT: This means that the infection may have spread to your lower respiratory tract, causing wheezing and a productive cough. It is important to continue coughing to clear mucus and avoid cough suppressants. Avoid smoking to prevent further lung irritation.  -GENERAL HEALTH MAINTENANCE: Smoking can make respiratory infections worse. Consider using nicotine alternatives like nicotine gum or pouches to help quit smoking.  INSTRUCTIONS:  Your prescriptions will be sent to the pharmacy. A work note for up to one week off has been provided. Please return if your symptoms do not improve or if they worsen.  Understanding & Managing Eustachian Tube Dysfunction  Your Eustachian Tube System The Eustachian tube is a vital connection between your middle ear and nasopharynx (upper throat). This small but crucial canal serves  three main purposes:  Equalizes air pressure between your ear and environment Drains fluid from your middle ear Protects against pathogens entering from your throat  Daily Care Routine Nasal Care Use sterile saline nasal mist in each nostril while leaning forward over sink. Blow nose while leaning forward over sink. Follow with nasal steroid spray Best performed before bedtime Allergy Management Take prescribed allergy medications regularly Use non-drowsy options during the day Consider Benadryl for nighttime only Safe Pressure Relief Methods Basic Methods Swallowing Yawning Chewing gum Warm compress application Toynbee Maneuver Close your mouth firmly Pinch your nose closed Swallow Repeat as needed Frenzel Maneuver Pinch nose closed Place tongue tip behind upper teeth Press tongue back/up against roof of mouth Make a "K" sound in your throat Medication Guide Decongestants Types: Sudafed (pseudoephedrine), afrin sprays Duration: 3-5 days maximum Warning: May increase blood pressure, can be difficult to discontinue if taken long term When to Seek Medical Care Contact your healthcare provider if you experience: Persistent symptoms despite treatment Severe ear pain Hearing loss Dizziness Fever

## 2023-05-23 NOTE — Progress Notes (Signed)
==============================  [facility]: [phone]   -- Medical Office Visit --  Patient: Emily [name]      Age: 48 y.o.       Sex:  female  Date:   05/23/2023 Today's Healthcare Provider: Lula Olszewski, MD  ==============================   CHIEF COMPLAINT: Fever, Generalized Body Aches, and Poor appetite  SUBJECTIVE: Background This is a 48 y.o. female who has Bipolar 1 disorder (HCC); Cervical dysplasia; Attention deficit; Elevated blood pressure reading; HLD (hyperlipidemia); Insulin resistance; and History of colonic polyps on their problem list.  History of Present Illness The patient, with a history of childhood seizures and recent upper respiratory infection, presents with persistent symptoms despite a course of Z-Pak and Mucinex. The symptoms began two weeks ago, worsening throughout the day until the patient left work. The patient describes the congestion as severe, initially nonproductive and located in the throat, to the point of choking and breathlessness. Despite some relief from the Z-Pak, the patient's condition has not improved significantly.  The patient also reports two days of chest pain, neck pain, jaw pain, and nausea, which they initially attributed to anxiety from Mucinex and possible vitamin overdose. The patient was taking a B complex, B12, elderberry, super C with zinc, magnesium, vitamin C, and a multivitamin. The patient also experienced a low-grade fever of 99.9 the day before the consultation.  The patient's sleep has been severely affected, with difficulty breathing at night. The patient uses a heating pad for back pain, which exacerbates the feeling of breathlessness. 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 The patient also reports a constant itch in the ear, but no pain. The patient is a cigar smoker.  Reviewed chart records that  patient  has a past medical history of ADHD, Bipolar 1 disorder (HCC), GERD (gastroesophageal reflux disease), HLD (hyperlipidemia) (05/25/2021), Seizures (HCC), and Ulcer (25+ yrs ago).  Discussed Past Medical History - History of seizures as a child - Tonsils present - History of tubes in both ears  Social History - Patient smokes cigars - Patient is a surgical assistant  Problem list overviews that were updated at today's visit:No problems updated.  Current Outpatient Medications on File Prior to Visit  Medication Sig   amphetamine-dextroamphetamine (ADDERALL) 10 MG tablet 10 mg.   gabapentin (NEURONTIN) 300 MG capsule Take 1 capsule (300 mg total) by mouth 3 (three) times daily.   traZODone (DESYREL) 100 MG tablet Take 1 tablet (100 mg total) by mouth at bedtime.   No current facility-administered medications on file prior to visit.   Medications Discontinued During This Encounter  Medication Reason   cyclobenzaprine (FLEXERIL) 5 MG tablet      Objective   Physical Exam     05/23/2023    8:11 AM 05/29/2022   12:43 PM 05/12/2022    8:01 AM  Vitals with BMI  Height  5\' 3"    Weight 173 lbs 6 oz 184 lbs   BMI  32.6   Systolic 139 122 308  Diastolic 84 81 95  Pulse 96 85 91   Wt Readings from Last 10 Encounters:  05/23/23 173 lb 6.4 oz (78.7 kg)  05/29/22 184 lb (83.5 kg)  05/04/22 179 lb (81.2 kg)  04/21/22 179 lb (81.2 kg)  03/13/22 179 lb (81.2 kg)  03/03/22 176 lb 6.1 oz (80 kg)  08/04/21 178 lb 6 oz (80.9 kg)  07/08/21 175 lb (  79.4 kg)  06/24/21 175 lb (79.4 kg)  04/20/21 175 lb 12.8 oz (79.7 kg)   Vital signs reviewed.  Nursing notes reviewed. Weight trend reviewed. Abnormalities and Problem-Specific physical exam findings:  VITALS: T- 99.9 HEENT: Right ear with effusion, tympanic membrane bulging. Left ear appears healthier than right. Nasal mucosa erythematous, indicative of severe sinus infection. CHEST: Right lung auscultation reveals goopy wheezy sounds.   General Appearance:  No acute distress appreciable.   Well-groomed, healthy-appearing female.  Well proportioned with no abnormal fat distribution.  Good muscle tone. Pulmonary:  Normal work of breathing at rest, no respiratory distress apparent. SpO2: 97 %  Musculoskeletal: All extremities are intact.  Neurological:  Awake, alert, oriented, and engaged.  No obvious focal neurological deficits or cognitive impairments.  Sensorium seems unclouded.   Speech is clear and coherent with logical content. Psychiatric:  Appropriate mood, pleasant and cooperative demeanor, thoughtful and engaged during the exam    Results for orders placed or performed in visit on 05/23/23  POC COVID-19  Result Value Ref Range   SARS Coronavirus 2 Ag Negative Negative  POCT Influenza A/B  Result Value Ref Range   Influenza A, POC Negative Negative   Influenza B, POC Negative Negative   Office Visit on 05/23/2023  Component Date Value   SARS Coronavirus 2 Ag 05/23/2023 Negative    Influenza A, POC 05/23/2023 Negative    Influenza B, POC 05/23/2023 Negative   No image results found. No results found.DG INJECT DIAG/THERA/INC NEEDLE/CATH/PLC EPI/LUMB/SAC W/IMG Result Date: 05/12/2022 CLINICAL DATA:  Lumbosacral spondylosis without myelopathy. Disc bulge L4-5. EXAM: SELECTIVE NERVE ROOT BLOCK AND TRANSFORAMINAL EPIDURAL STEROID INJECTION UNDER FLUOROSCOPY FLUOROSCOPY: Radiation Exposure Index (as provided by the fluoroscopic device): 2.3 mGy air Kerma TECHNIQUE: An appropriate skin entry site was determined under fluoroscopy. Operator donned sterile gloves and mask. Site was marked, prepped with Betadine, draped in usual sterile fashion, infiltrated locally with 1% lidocaine. A 22 gauge spinal needle was advanced to the superior ventral margin of the right L4-5 neural foramen. Diagnostic injection of 2 ml Omnipaque 180 showed partial outlining of the exiting nerve root as well as epidural extension of contrast, with no  intravascular or subarachnoid component. 80 mg Depo-Medrol in 3 ml lidocaine 1% was administered. The patient tolerated procedure well, with no immediate complication. IMPRESSION: 1. Technically successful right L4-5 selective nerve root block and transforaminal epidural steroid injection Electronically Signed   By: Corlis Leak M.D.   On: 05/12/2022 08:14     Assessment & Plan Chronic maxillary sinusitis Severe Sinus Infection The severe sinus infection has persisted for nearly two weeks, causing congestion, a nonproductive cough, and breathing difficulties. A Z-Pak provided partial relief, but symptoms returned. Examination confirmed a severe sinus infection extending to the right ear, likely bacterial. A longer antibiotic course and sinus rinses are necessary to clear pus pockets. Augmentin is preferred for sinus infections, and saline rinses are essential. Potential side effects of Augmentin, such as gastrointestinal upset, were discussed, along with the importance of completing the full course. Prescribe Augmentin for 10 days and recommend Simply Saline nasal spray for sinus rinses. Use Sudafed or Flonase as needed and avoid smoking to aid recovery. 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 Treat with Augmentin as part of the sinus infection treatment and recommend sinus rinses  to help clear the infection through the Eustachian tube. Acute bronchitis with COPD (HCC) Possible Lower Respiratory Tract Involvement Goopy wheezy sounds in the right lung suggest possible lower respiratory tract involvement related to the sinus infection. A productive cough is noted, with no significant consolidation expected on a chest x-ray. Productive coughing is important to clear mucus, and cough  suppressants should be avoided. Smoking may cause further lung irritation, so avoiding it is advised. Flu-like symptoms  Body aches  Acute cough  Fever, unspecified fever cause       Orders Placed During this Encounter:   Orders Placed This Encounter  Procedures   POC COVID-19    Previously tested for COVID-19:   Unknown    Resident in a congregate (group) care setting:   Unknown    Employed in healthcare setting:   Unknown    Pregnant:   No   POCT Influenza A/B   Meds ordered this encounter  Medications   fluticasone (FLONASE) 50 MCG/ACT nasal spray    Sig: Place 2 sprays into both nostrils daily.    Dispense:  16 g    Refill:  6   Saline (SIMPLY SALINE) 0.9 % AERS    Sig: Place 2 each into the nose as directed. Use nightly for sinus hygiene long-term.  Can also be used as many times daily as desired to assist with clearing congested sinuses.    Dispense:  127 mL    Refill:  11   loratadine (CLARITIN) 10 MG tablet    Sig: Take 1 tablet (10 mg total) by mouth daily.    Dispense:  30 tablet    Refill:  11   pseudoephedrine (SUDAFED 12 HOUR) 120 MG 12 hr tablet    Sig: Take 1 tablet (120 mg total) by mouth 2 (two) times daily.    Dispense:  20 tablet    Refill:  0   amoxicillin-clavulanate (AUGMENTIN) 875-125 MG tablet    Sig: Take 1 tablet by mouth 2 (two) times daily.    Dispense:  20 tablet    Refill:  0   General Health Maintenance Smoking complicates respiratory infections. Nicotine alternatives are recommended to aid in quitting smoking. Recommend nicotine gum or pouches like Zins to manage nicotine cravings.  Follow-up Prescriptions will be sent to the pharmacy. A work note for up to one week off is provided. Return if symptoms do not improve or worsen.  Medical Decision Making: 1 acute illness with systemic symptoms Prescription drug management     This document was synthesized by artificial intelligence (Abridge) using HIPAA-compliant recording of the  clinical interaction;   We discussed the use of AI scribe software for clinical note transcription with the patient, who gave verbal consent to proceed.    Additional Info: This encounter employed state-of-the-art, real-time, collaborative documentation. The patient actively reviewed and assisted in updating their electronic medical record on a shared screen, ensuring transparency and facilitating joint problem-solving for the problem list, overview, and plan. This approach promotes accurate, informed care. The treatment plan was discussed and reviewed in detail, including medication safety, potential side effects, and all patient questions. We confirmed understanding and comfort with the plan. Follow-up instructions were established, including contacting the office for any concerns, returning if symptoms worsen, persist, or new symptoms develop, and precautions for potential emergency department visits.

## 2023-05-30 ENCOUNTER — Encounter: Payer: Self-pay | Admitting: Physician Assistant

## 2023-05-30 ENCOUNTER — Ambulatory Visit (INDEPENDENT_AMBULATORY_CARE_PROVIDER_SITE_OTHER): Payer: BC Managed Care – PPO | Admitting: Physician Assistant

## 2023-05-30 VITALS — BP 110/70 | HR 91 | Temp 98.2°F | Ht 63.0 in | Wt 174.5 lb

## 2023-05-30 DIAGNOSIS — E538 Deficiency of other specified B group vitamins: Secondary | ICD-10-CM

## 2023-05-30 DIAGNOSIS — R41840 Attention and concentration deficit: Secondary | ICD-10-CM

## 2023-05-30 DIAGNOSIS — E559 Vitamin D deficiency, unspecified: Secondary | ICD-10-CM

## 2023-05-30 DIAGNOSIS — Z0000 Encounter for general adult medical examination without abnormal findings: Secondary | ICD-10-CM | POA: Diagnosis not present

## 2023-05-30 DIAGNOSIS — E88819 Insulin resistance, unspecified: Secondary | ICD-10-CM

## 2023-05-30 DIAGNOSIS — G8929 Other chronic pain: Secondary | ICD-10-CM

## 2023-05-30 DIAGNOSIS — Z114 Encounter for screening for human immunodeficiency virus [HIV]: Secondary | ICD-10-CM | POA: Diagnosis not present

## 2023-05-30 DIAGNOSIS — Z1159 Encounter for screening for other viral diseases: Secondary | ICD-10-CM

## 2023-05-30 DIAGNOSIS — Z720 Tobacco use: Secondary | ICD-10-CM

## 2023-05-30 DIAGNOSIS — M545 Low back pain, unspecified: Secondary | ICD-10-CM

## 2023-05-30 DIAGNOSIS — E669 Obesity, unspecified: Secondary | ICD-10-CM

## 2023-05-30 DIAGNOSIS — F5101 Primary insomnia: Secondary | ICD-10-CM

## 2023-05-30 LAB — HEMOGLOBIN A1C: Hgb A1c MFr Bld: 6.5 % (ref 4.6–6.5)

## 2023-05-30 LAB — COMPREHENSIVE METABOLIC PANEL
ALT: 10 U/L (ref 0–35)
AST: 15 U/L (ref 0–37)
Albumin: 4.2 g/dL (ref 3.5–5.2)
Alkaline Phosphatase: 84 U/L (ref 39–117)
BUN: 13 mg/dL (ref 6–23)
CO2: 31 meq/L (ref 19–32)
Calcium: 9.8 mg/dL (ref 8.4–10.5)
Chloride: 102 meq/L (ref 96–112)
Creatinine, Ser: 0.61 mg/dL (ref 0.40–1.20)
GFR: 105.67 mL/min (ref >60.00)
Glucose, Bld: 76 mg/dL (ref 70–99)
Potassium: 4 meq/L (ref 3.5–5.1)
Sodium: 137 meq/L (ref 135–145)
Total Bilirubin: 0.3 mg/dL (ref 0.2–1.2)
Total Protein: 7.4 g/dL (ref 6.0–8.3)

## 2023-05-30 LAB — VITAMIN B12: Vitamin B-12: 1454 pg/mL — ABNORMAL HIGH (ref 211–911)

## 2023-05-30 LAB — CBC WITH DIFFERENTIAL/PLATELET
Basophils Absolute: 0.1 10*3/uL (ref 0.0–0.1)
Basophils Relative: 0.5 % (ref 0.0–3.0)
Eosinophils Absolute: 0.3 10*3/uL (ref 0.0–0.7)
Eosinophils Relative: 2.3 % (ref 0.0–5.0)
HCT: 40.7 % (ref 36.0–46.0)
Hemoglobin: 13.5 g/dL (ref 12.0–15.0)
Lymphocytes Relative: 26.1 % (ref 12.0–46.0)
Lymphs Abs: 3.3 10*3/uL (ref 0.7–4.0)
MCHC: 33.1 g/dL (ref 30.0–36.0)
MCV: 90.5 fL (ref 78.0–100.0)
Monocytes Absolute: 0.7 10*3/uL (ref 0.1–1.0)
Monocytes Relative: 5.5 % (ref 3.0–12.0)
Neutro Abs: 8.2 10*3/uL — ABNORMAL HIGH (ref 1.4–7.7)
Neutrophils Relative %: 65.6 % (ref 43.0–77.0)
Platelets: 557 10*3/uL — ABNORMAL HIGH (ref 150.0–400.0)
RBC: 4.49 Mil/uL (ref 3.87–5.11)
RDW: 14.5 % (ref 11.5–15.5)
WBC: 12.5 10*3/uL — ABNORMAL HIGH (ref 4.0–10.5)

## 2023-05-30 LAB — LIPID PANEL
Cholesterol: 208 mg/dL — ABNORMAL HIGH (ref 0–200)
HDL: 42.6 mg/dL (ref >39.00)
LDL Cholesterol: 124 mg/dL — ABNORMAL HIGH (ref 0–99)
NonHDL: 165.35
Total CHOL/HDL Ratio: 5
Triglycerides: 206 mg/dL — ABNORMAL HIGH (ref 0.0–149.0)
VLDL: 41.2 mg/dL — ABNORMAL HIGH (ref 0.0–40.0)

## 2023-05-30 LAB — VITAMIN D 25 HYDROXY (VIT D DEFICIENCY, FRACTURES): VITD: 40.19 ng/mL (ref 30.00–100.00)

## 2023-05-30 MED ORDER — NICOTINE 7 MG/24HR TD PT24: 7.0000 mg | MEDICATED_PATCH | Freq: Every day | TRANSDERMAL | 0 refills | Status: DC

## 2023-05-30 NOTE — Patient Instructions (Addendum)
It was great to see you!  I will send in nicotine patch for you  Please go to the lab for blood work.   Our office will call you with your results unless you have chosen to receive results via MyChart.  If your blood work is normal we will follow-up each year for physicals and as scheduled for chronic medical problems.  If anything is abnormal we will treat accordingly and get you in for a follow-up.  Take care,  Lelon Mast

## 2023-05-30 NOTE — Progress Notes (Signed)
Subjective:    Emily [name] is a 48 y.o. female and is here for a comprehensive physical exam.  HPI  Health Maintenance Due  Topic Date Due   Pneumococcal Vaccine 82-56 Years old (1 of 2 - PCV) Never done   HIV Screening  Never done   Hepatitis C Screening  Never done   Cervical Cancer Screening (HPV/Pap Cotest)  Never done   MAMMOGRAM  06/10/2022    Acute Concerns: None  Chronic Issues: Attention Deficit  She reports experiencing  mood swings and distorted/mental fog.  Gynecologist has prescribed Adderall 10 mg and was placed on an estrogen patch. She plans on tapering it off soon. She started at 20 mg and is currently on 10 mg.   She states. She currently feeling well overall. Symptoms are well controlled with medications. She states that she feels this more effective than adderall.   Arthritis  She reports that her hip pain is a result of arthritis.  Condition is followed by Dr. Jean Rosenthal.  Currently taking gabapentin 300 mg daily to be able to function. She states that gabapentin helps prevent her sweating as well.  Reports numbness in hands but attributes that to the nature of her work.   Pre-diabetic/ obesity Previous A1c levels indicated pre-diabetic. She's requesting to check levels today. No concerns or symptoms at this time.  Trouble falling asleep She complains of trouble falling asleep and staying asleep. She denies any sleep apnea symptoms and snoring.  She reports taking trazodone 100 mg, she has to take it before 8 o'clock or else she won't take it as it would cause her to sleep heavy.   History of smoking  She reports a distant history of smoking cigarettes.  She stopped about 8 years ago. However, she's currently smoking cigars She typically smokes 2-4 cigars. She denies trying any methods to aid in smoking cessation.    Health Maintenance: Immunizations -- N/A Colonoscopy -- last done on 07-08-21. Several ulcers and polyps. Recall in 10 years.   Mammogram -- UpToDate, requesting records PAP -- up to date, done at an outside clinic  Bone Density -- N/A Diet -- typical, well balanced diet Exercise -- no exercise   Sleep habits -- troubles falling asleep Mood -- experiencing stress due to work.    UTD with dentist? - yes UTD with eye doctor? - no  Weight history: Wt Readings from Last 10 Encounters:  05/30/23 174 lb 8 oz (79.2 kg)  05/23/23 173 lb 6.4 oz (78.7 kg)  05/29/22 184 lb (83.5 kg)  05/04/22 179 lb (81.2 kg)  04/21/22 179 lb (81.2 kg)  03/13/22 179 lb (81.2 kg)  03/03/22 176 lb 6.1 oz (80 kg)  08/04/21 178 lb 6 oz (80.9 kg)  07/08/21 175 lb (79.4 kg)  06/24/21 175 lb (79.4 kg)   Body mass index is 30.91 kg/m. Patient's last menstrual period was 05/16/2023 (approximate).  Alcohol use:  reports current alcohol use.  Tobacco use:  Tobacco Use: High Risk (05/30/2023)   Patient History    Smoking Tobacco Use: Every Day    Smokeless Tobacco Use: Former    Passive Exposure: Past   Eligible for lung cancer screening? no     04/20/2021    3:29 PM  Depression screen PHQ 2/9  Decreased Interest 3  Down, Depressed, Hopeless 1  PHQ - 2 Score 4  Altered sleeping 3  Tired, decreased energy 3  Change in appetite 0  Feeling bad or failure about yourself  0  Trouble concentrating 3  Moving slowly or fidgety/restless 2  Suicidal thoughts 0  PHQ-9 Score 15  Difficult doing work/chores Very difficult     Other providers/specialists: Patient Care Team: Jarold Motto, [place] as PCP - General (Physician Assistant)    PMHx, SurgHx, SocialHx, Medications, and Allergies were reviewed in the Visit Navigator and updated as appropriate.   Past Medical History:  Diagnosis Date   ADHD    Arthritis 03/07/22   Lumbar   Bipolar 1 disorder (HCC)    GERD (gastroesophageal reflux disease)    H/O ULCER   HLD (hyperlipidemia) 05/25/2021   Seizures (HCC)    AS A CHILD,ONCE STARTED SCHHOL NO OTHER SEIZURE   Ulcer 25+  yrs ago   stress related     Past Surgical History:  Procedure Laterality Date   TUBAL LIGATION     TUBES IN EARS AS A CHILD     VAGINAL DELIVERY     x3     Family History  Problem Relation Age of Onset   Breast cancer Mother    COPD Mother    Heart disease Mother    Hypertension Mother    Cancer Mother    Heart attack Father    Hypertension Father    Heart disease Father    Cancer Maternal Aunt    Cancer Maternal Aunt    Lupus Maternal Aunt    Heart disease Maternal Grandmother    Heart disease Other    Cancer Other    Colon cancer Neg Hx    Colon polyps Neg Hx    Esophageal cancer Neg Hx    Stomach cancer Neg Hx    Rectal cancer Neg Hx     Social History   Tobacco Use   Smoking status: Every Day    Types: Cigars    Passive exposure: Past   Smokeless tobacco: Former  Building services engineer status: Former  Substance Use Topics   Alcohol use: Yes    Comment: rarely   Drug use: Yes    Types: Marijuana    Comment: regularly    Review of Systems:   Review of Systems  Constitutional:  Negative for chills, fever, malaise/fatigue and weight loss.  HENT:  Negative for hearing loss, sinus pain and sore throat.   Respiratory:  Positive for wheezing. Negative for cough, hemoptysis and shortness of breath.   Cardiovascular:  Negative for chest pain, palpitations, leg swelling and PND.  Gastrointestinal:  Negative for abdominal pain, constipation, diarrhea, heartburn, nausea and vomiting.  Genitourinary:  Negative for dysuria, frequency and urgency.  Musculoskeletal:  Negative for back pain, myalgias and neck pain.  Skin:  Negative for itching and rash.  Neurological:  Negative for dizziness, tingling, seizures and headaches.  Endo/Heme/Allergies:  Negative for polydipsia.  Psychiatric/Behavioral:  Negative for depression. The patient is nervous/anxious and has insomnia.     Objective:   BP 110/70 (BP Location: Left Arm, Patient Position: Sitting, Cuff Size:  Large)   Pulse 91   Temp 98.2 F (36.8 C) (Temporal)   Ht 5\' 3"  (1.6 m)   Wt 174 lb 8 oz (79.2 kg)   LMP 05/16/2023 (Approximate)   SpO2 98%   BMI 30.91 kg/m  Body mass index is 30.91 kg/m.   General Appearance:    Alert, cooperative, no distress, appears stated age  Head:    Normocephalic, without obvious abnormality, atraumatic  Eyes:    PERRL, conjunctiva/corneas clear, EOM's intact, fundi  benign, both eyes  Ears:    Normal TM's and external ear canals, both ears  Nose:   Nares normal, septum midline, mucosa normal, no drainage    or sinus tenderness  Throat:   Lips, mucosa, and tongue normal; teeth and gums normal  Neck:   Supple, symmetrical, trachea midline, no adenopathy;    thyroid:  no enlargement/tenderness/nodules; no carotid   bruit or JVD  Back:     Symmetric, no curvature, ROM normal, no CVA tenderness  Lungs:     Clear to auscultation bilaterally, respirations unlabored  Chest Wall:    No tenderness or deformity   Heart:    Regular rate and rhythm, S1 and S2 normal, no murmur, rub or gallop  Breast Exam:    Deferred   Abdomen:     Soft, non-tender, bowel sounds active all four quadrants,    no masses, no organomegaly  Genitalia:    Deferred   Extremities:   Extremities normal, atraumatic, no cyanosis or edema  Pulses:   2+ and symmetric all extremities  Skin:   Skin color, texture, turgor normal, no rashes or lesions  Lymph nodes:   Cervical, supraclavicular, and axillary nodes normal  Neurologic:   CNII-XII intact, normal strength, sensation and reflexes    throughout    Assessment/Plan:   Routine physical examination Today patient counseled on age appropriate routine health concerns for screening and prevention, each reviewed and up to date or declined. Immunizations reviewed and up to date or declined. Labs ordered and reviewed. Risk factors for depression reviewed and negative. Hearing function and visual acuity are intact. ADLs screened and addressed  as needed. Functional ability and level of safety reviewed and appropriate. Education, counseling and referrals performed based on assessed risks today. Patient provided with a copy of personalized plan for preventive services.  Attention deficit Overall controlled Management per specialist  Obesity, unspecified class, unspecified obesity type, unspecified whether serious comorbidity present Continue efforts at healthy lifestyle  B12 deficiency Update blood work and provide recommendations  Vitamin D deficiency Update blood work and provide recommendations  Chronic bilateral low back pain, unspecified whether sciatica present Overall controlled Management per specialist  Encounter for screening for other viral diseases Update today  Screening for HIV (human immunodeficiency virus) Update today  Insulin resistance Overall controlled Management per specialist  Primary insomnia Uncontrolled Will continue current regimen for now as we are unable to determine other options that will work for her situation Will continue to work on adequate sleep hygiene  Tobacco abuse Ready to quit We will send in patches  Continue close follow-up if accountability needed, otherwise continue to counsel at each visit   Jarold Motto, PA-C [place] [facility] M Kad[facility],acting as a Neurosurgeon for [employer], PA.,have documented all relevant documentation on the behalf of Jarold Motto, PA,as directed by  Jarold Motto, PA while in the presence of Jarold Motto, [place].   I, Jarold Motto, [place], have reviewed all documentation for this visit. The documentation on 05/30/23 for the exam, diagnosis, procedures, and orders are all accurate and complete.

## 2023-05-31 ENCOUNTER — Encounter: Payer: Self-pay | Admitting: Physician Assistant

## 2023-05-31 ENCOUNTER — Other Ambulatory Visit: Payer: Self-pay | Admitting: Physician Assistant

## 2023-05-31 ENCOUNTER — Encounter: Payer: Self-pay | Admitting: Obstetrics and Gynecology

## 2023-05-31 DIAGNOSIS — D729 Disorder of white blood cells, unspecified: Secondary | ICD-10-CM

## 2023-05-31 DIAGNOSIS — E785 Hyperlipidemia, unspecified: Secondary | ICD-10-CM

## 2023-05-31 DIAGNOSIS — D691 Qualitative platelet defects: Secondary | ICD-10-CM

## 2023-05-31 LAB — HEPATITIS C ANTIBODY: Hepatitis C Ab: NONREACTIVE

## 2023-05-31 LAB — HIV ANTIBODY (ROUTINE TESTING W REFLEX): HIV 1&2 Ab, 4th Generation: NONREACTIVE

## 2023-06-14 ENCOUNTER — Other Ambulatory Visit: Payer: Self-pay | Admitting: Sports Medicine

## 2023-07-01 DIAGNOSIS — F1120 Opioid dependence, uncomplicated: Secondary | ICD-10-CM | POA: Diagnosis not present

## 2023-07-03 ENCOUNTER — Inpatient Hospital Stay: Payer: BC Managed Care – PPO

## 2023-07-03 ENCOUNTER — Inpatient Hospital Stay: Payer: BC Managed Care – PPO | Attending: Oncology | Admitting: Oncology

## 2023-07-03 ENCOUNTER — Encounter: Payer: Self-pay | Admitting: Oncology

## 2023-07-03 ENCOUNTER — Encounter: Payer: Self-pay | Admitting: Nutrition

## 2023-07-03 ENCOUNTER — Other Ambulatory Visit: Payer: Self-pay | Admitting: Oncology

## 2023-07-03 VITALS — BP 119/67 | HR 81 | Temp 98.5°F | Resp 18 | Ht 63.0 in | Wt 178.9 lb

## 2023-07-03 DIAGNOSIS — F17290 Nicotine dependence, other tobacco product, uncomplicated: Secondary | ICD-10-CM | POA: Diagnosis not present

## 2023-07-03 DIAGNOSIS — Z0000 Encounter for general adult medical examination without abnormal findings: Secondary | ICD-10-CM

## 2023-07-03 DIAGNOSIS — E611 Iron deficiency: Secondary | ICD-10-CM | POA: Insufficient documentation

## 2023-07-03 DIAGNOSIS — D72829 Elevated white blood cell count, unspecified: Secondary | ICD-10-CM

## 2023-07-03 DIAGNOSIS — Z79899 Other long term (current) drug therapy: Secondary | ICD-10-CM | POA: Insufficient documentation

## 2023-07-03 DIAGNOSIS — D75839 Thrombocytosis, unspecified: Secondary | ICD-10-CM

## 2023-07-03 HISTORY — DX: Encounter for general adult medical examination without abnormal findings: Z00.00

## 2023-07-03 LAB — CMP (CANCER CENTER ONLY)
ALT: 8 U/L (ref 0–44)
AST: 8 U/L — ABNORMAL LOW (ref 15–41)
Albumin: 4.2 g/dL (ref 3.5–5.0)
Alkaline Phosphatase: 75 U/L (ref 38–126)
Anion gap: 7 (ref 5–15)
BUN: 11 mg/dL (ref 6–20)
CO2: 30 mmol/L (ref 22–32)
Calcium: 9.3 mg/dL (ref 8.9–10.3)
Chloride: 104 mmol/L (ref 98–111)
Creatinine: 0.59 mg/dL (ref 0.44–1.00)
GFR, Estimated: >60 mL/min (ref >60)
Glucose, Bld: 89 mg/dL (ref 70–99)
Potassium: 4.1 mmol/L (ref 3.5–5.1)
Sodium: 141 mmol/L (ref 135–145)
Total Bilirubin: 0.3 mg/dL (ref 0.0–1.2)
Total Protein: 7 g/dL (ref 6.5–8.1)

## 2023-07-03 LAB — IRON AND TIBC
Iron: 22 ug/dL — ABNORMAL LOW (ref 28–170)
Saturation Ratios: 5 % — ABNORMAL LOW (ref 10.4–31.8)
TIBC: 413 ug/dL (ref 250–450)
UIBC: 391 ug/dL

## 2023-07-03 LAB — CBC WITH DIFFERENTIAL (CANCER CENTER ONLY)
Abs Immature Granulocytes: 0.05 10*3/uL (ref 0.00–0.07)
Basophils Absolute: 0 10*3/uL (ref 0.0–0.1)
Basophils Relative: 0 %
Eosinophils Absolute: 0.2 10*3/uL (ref 0.0–0.5)
Eosinophils Relative: 1 %
HCT: 41 % (ref 36.0–46.0)
Hemoglobin: 13.7 g/dL (ref 12.0–15.0)
Immature Granulocytes: 0 %
Lymphocytes Relative: 24 %
Lymphs Abs: 3 10*3/uL (ref 0.7–4.0)
MCH: 30.6 pg (ref 26.0–34.0)
MCHC: 33.4 g/dL (ref 30.0–36.0)
MCV: 91.7 fL (ref 80.0–100.0)
Monocytes Absolute: 0.7 10*3/uL (ref 0.1–1.0)
Monocytes Relative: 6 %
Neutro Abs: 8.6 10*3/uL — ABNORMAL HIGH (ref 1.7–7.7)
Neutrophils Relative %: 69 %
Platelet Count: 483 10*3/uL — ABNORMAL HIGH (ref 150–400)
RBC: 4.47 MIL/uL (ref 3.87–5.11)
RDW: 15.3 % (ref 11.5–15.5)
WBC Count: 12.6 10*3/uL — ABNORMAL HIGH (ref 4.0–10.5)
nRBC: 0 % (ref 0.0–0.2)

## 2023-07-03 LAB — C-REACTIVE PROTEIN: CRP: 1.2 mg/dL — ABNORMAL HIGH (ref <1.0)

## 2023-07-03 LAB — LACTATE DEHYDROGENASE: LDH: 145 U/L (ref 98–192)

## 2023-07-03 LAB — SEDIMENTATION RATE: Sed Rate: 38 mm/h — ABNORMAL HIGH (ref 0–22)

## 2023-07-03 LAB — FERRITIN: Ferritin: 21 ng/mL (ref 11–307)

## 2023-07-03 NOTE — Assessment & Plan Note (Signed)
 Smoker, currently using a nicotine patch to quit. Recent colonoscopy in 2023 showed hemorrhoids and benign polyps. Mammogram follow-up recommended but not completed due to insurance issues. - Encourage smoking cessation

## 2023-07-03 NOTE — Progress Notes (Signed)
 [facility]  HEMATOLOGY CLINIC CONSULTATION NOTE   PATIENT NAME: Emily [name]   MR#: [id number] DOB: 12-Apr-1976  DATE OF SERVICE: 07/03/2023   REFERRING PHYSICIAN  Jarold Motto, PA   Patient Care Team: Jarold Motto, [place] as PCP - General (Physician Assistant)   REASON FOR CONSULTATION/ CHIEF COMPLAINT:  Leukocytosis and thrombocytosis for further evaluation  ASSESSMENT & PLAN:  Emily [name] is a 48 y.o. lady with a past medical history of ADHD, bipolar disorder, GERD, dyslipidemia, nicotine dependence, was referred to our service for evaluation of leukocytosis and thrombocytosis.    Thrombocytosis Chronic elevated platelet count since 2021, with the most recent count in January 2025 at 557,000.   Differential diagnosis includes myeloproliferative neoplasm (MPN) such as essential thrombocytosis, iron deficiency, and secondary causes like inflammation or smoking. Discussed potential JAK2 mutation and associated risks, including increased risk of clotting. Explained hydroxyurea use to keep platelet count below 500,000 and preventing complications if MPN is confirmed. Discussed future risks of leukemias and myelofibrosis with JAK2 mutation.   Labs today revealed elevated platelet count of 483,000, but it is improved compared to prior.  Ferritin is normal at 21, although borderline low.  Rest of the iron studies are pending.  Hemoglobin 13.7, normal.  White count stable at 12,600 with normal differential.  Will proceed with JAK2 testing with reflex analysis for CALR, MPL, exon 12-15 mutations.  Bone marrow biopsy will be considered depending on these results.  - Consider IV iron if iron deficiency is confirmed  - Schedule follow-up phone call in two weeks to discuss results  - Plan in-person follow-up in three months  Leukocytosis Chronic elevated white blood cell count, most recent count at 12,500. Differential diagnosis includes chronic  inflammation, smoking, and potential bone marrow disorders.   We will check BCR/ABL 1 and flow cytometry of peripheral blood to rule out primary bone marrow disorders.  - Advise smoking reduction to help lower white count  Healthcare maintenance Smoker, currently using a nicotine patch to quit. Recent colonoscopy in 2023 showed hemorrhoids and benign polyps. Mammogram follow-up recommended but not completed due to insurance issues. - Encourage smoking cessation   I reviewed lab results and outside records for this visit and discussed relevant results with the patient. Diagnosis, plan of care and treatment options were also discussed in detail with the patient. Opportunity provided to ask questions and answers provided to her apparent satisfaction. Provided instructions to call our clinic with any problems, questions or concerns prior to return visit. I recommended to continue follow-up with PCP and sub-specialists. She verbalized understanding and agreed with the plan. No barriers to learning was detected.  Meryl Crutch, MD  07/03/2023 2:43 PM  [facility] - A DEPT OF [facility] [address] [place] [address] Dept: [phone] Dept Fax: [phone]   HISTORY OF PRESENT ILLNESS:  Discussed the use of AI scribe software for clinical note transcription with the patient, who gave verbal consent to proceed.   On 05/30/2023, routine labs at her PCPs office showed white count of 12,500 with ANC of 8200, normal differential.  Platelet count was increased at 557,000.  On review of records, she had thrombocytosis dating back to 2021 with platelet count of 478,000-557,000.  She also had chronic leukocytosis with white count ranging between 12,500-17,000 during this timeframe.  Hence a referral was sent to [place] for further evaluation.  She has been tested for  rheumatoid arthritis due to the chronic back pain, but the results  were negative. She is a smoker, consuming three to four cigars a day, and is currently trying to quit using a nicotine patch. The patient also uses THC-8.  She reports heavy menstrual cycles, which have been a long-term issue.  She is on an estrogen patch for perimenopausal symptoms, including mood swings and hot flashes.  She denies fever, cough, diarrhea, or other infectious symptoms.  She denies epistaxis, bloody stool, melena, hematuria, bruising or other bleeding symptoms. She also denies unintentional weight loss, night sweats or other constitutional symptoms.  MEDICAL HISTORY Past Medical History:  Diagnosis Date   ADHD    Arthritis 03/07/22   Lumbar   Bipolar 1 disorder (HCC)    GERD (gastroesophageal reflux disease)    H/O [facility]LCER   Healthcare maintenance 07/03/2023   HLD (hyperlipidemia) 05/25/2021   Ulcer 25+ yrs ago   stress related     SURGICAL HISTORY Past Surgical History:  Procedure Laterality Date   TUBAL LIGATION     TUBES IN EARS AS A CHILD     VAGINAL DELIVERY     x3     SOCIAL HISTORY: She reports that she has been smoking cigars. She has been exposed to tobacco smoke. She has quit using smokeless tobacco. She reports current alcohol use. She reports current drug use. Drug: Marijuana. Social History   Socioeconomic History   Marital status: Divorced    Spouse name: Not on file   Number of children: Not on file   Years of education: Not on file   Highest education level: Some college, no degree  Occupational History   Not on file  Tobacco Use   Smoking status: Every Day    Types: Cigars    Passive exposure: Past   Smokeless tobacco: Former  Building services engineer status: Former  Substance and Sexual Activity   Alcohol use: Yes    Comment: rarely   Drug use: Yes    Types: Marijuana    Comment: regularly   Sexual activity: Yes    Birth control/protection: Surgical  Other Topics Concern   Not on file  Social History Narrative   Surgical assistant     Thinking of career change -- local driving, 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)   Social Drivers of Health   Financial Resource Strain: Medium Risk (05/29/2023)   Overall Financial Resource Strain (CARDIA)    Difficulty of Paying Living Expenses: Somewhat hard  Food Insecurity: Food Insecurity Present (07/03/2023)   Hunger Vital Sign    Worried About Running Out of Food in the Last Year: Sometimes true    Ran Out of Food in the Last Year: Sometimes true  Transportation Needs: No Transportation Needs (07/03/2023)   PRAPARE - Administrator, Civil Service (Medical): No    Lack of Transportation (Non-Medical): No  Physical Activity: Insufficiently Active (05/29/2023)   Exercise Vital Sign    Days of Exercise per Week: 2 days    Minutes of Exercise per Session: 10 min  Stress: Stress Concern Present (05/29/2023)   [employer] of Occupational Health - Occupational Stress Questionnaire    Feeling of Stress : Very much  Social Connections: Socially Isolated (05/29/2023)   Social Connection and Isolation Panel [NHANES]    Frequency of Communication with Friends and Family: Never    Frequency of Social Gatherings with Friends and Family: Never    Attends Religious Services:  Never    Active Member of Clubs or Organizations: No    Attends Banker Meetings: Not on file    Marital Status: Divorced  Intimate Partner Violence: Not At Risk (07/03/2023)   Humiliation, Afraid, Rape, and Kick questionnaire    Fear of Current or Ex-Partner: No    Emotionally Abused: No    Physically Abused: No    Sexually Abused: No    FAMILY HISTORY: Her family history includes Breast cancer in her mother; COPD in her mother; Cancer in her maternal aunt, maternal aunt, mother, and another family member; Heart attack in her father; Heart disease in her father, maternal grandmother, mother, and another family member; Hypertension in her father and mother; Lupus in her  maternal aunt.  CURRENT MEDICATIONS   Current Outpatient Medications  Medication Instructions   amphetamine-dextroamphetamine (ADDERALL) 10 MG tablet 10 mg   estradiol (VIVELLE-DOT) 0.05 MG/24HR patch 1 patch, 2 times weekly   fluticasone (FLONASE) 50 MCG/ACT nasal spray 2 sprays, Each Nare, Daily   gabapentin (NEURONTIN) 300 MG capsule TAKE 1 CAPSULE(300 MG) BY MOUTH THREE TIMES DAILY   nicotine (NICODERM CQ - DOSED IN MG/24 HR) 7 mg, Transdermal, Daily   progesterone (PROMETRIUM) 100 mg, Daily   traZODone (DESYREL) 100 MG tablet TAKE 1 TABLET(100 MG) BY MOUTH AT BEDTIME     ALLERGIES  She has no known allergies.  REVIEW OF SYSTEMS:  Review of Systems - Oncology   Rest of the pertinent review of systems is unremarkable except as mentioned above in HPI.  PHYSICAL EXAMINATION:    Onc Performance Status - 07/03/23 1036       ECOG Perf Status   ECOG Perf Status Fully active, able to carry on all pre-disease performance without restriction      KPS SCALE   KPS % SCORE Normal, no compliants, no evidence of disease             Vitals:   07/03/23 1017  BP: 119/67  Pulse: 81  Resp: 18  Temp: 98.5 F (36.9 C)  SpO2: 95%   Filed Weights   07/03/23 1017  Weight: 178 lb 14.4 oz (81.1 kg)    Physical Exam Constitutional:      General: She is not in acute distress.    Appearance: Normal appearance.  HENT:     Head: Normocephalic and atraumatic.  Eyes:     General: No scleral icterus.    Conjunctiva/sclera: Conjunctivae normal.  Cardiovascular:     Rate and Rhythm: Normal rate and regular rhythm.     Heart sounds: Normal heart sounds.  Pulmonary:     Effort: Pulmonary effort is normal.     Breath sounds: Normal breath sounds.  Abdominal:     General: There is no distension.  Musculoskeletal:     Right lower leg: No edema.     Left lower leg: No edema.  Neurological:     General: No focal deficit present.     Mental Status: She is alert and oriented to  person, place, and time.  Psychiatric:        Mood and Affect: Mood normal.        Behavior: Behavior normal.        Thought Content: Thought content normal.     LABORATORY DATA:   I have reviewed the data as listed.  Results for orders placed or performed in visit on 07/03/23  Ferritin  Result Value Ref Range   Ferritin 21 11 - 307  ng/mL  Sedimentation rate  Result Value Ref Range   Sed Rate 38 (H) 0 - 22 mm/hr  Lactate dehydrogenase  Result Value Ref Range   LDH 145 98 - 192 U/L  CMP (Cancer Center only)  Result Value Ref Range   Sodium 141 135 - 145 mmol/L   Potassium 4.1 3.5 - 5.1 mmol/L   Chloride 104 98 - 111 mmol/L   CO2 30 22 - 32 mmol/L   Glucose, Bld 89 70 - 99 mg/dL   BUN 11 6 - 20 mg/dL   Creatinine 4.25 9.56 - 1.00 mg/dL   Calcium 9.3 8.9 - 38.7 mg/dL   Total Protein 7.0 6.5 - 8.1 g/dL   Albumin 4.2 3.5 - 5.0 g/dL   AST 8 (L) 15 - 41 U/L   ALT 8 0 - 44 U/L   Alkaline Phosphatase 75 38 - 126 U/L   Total Bilirubin 0.3 0.0 - 1.2 mg/dL   GFR, Estimated >56 >43 mL/min   Anion gap 7 5 - 15  CBC with Differential (Cancer Center Only)  Result Value Ref Range   WBC Count 12.6 (H) 4.0 - 10.5 K/uL   RBC 4.47 3.87 - 5.11 MIL/uL   Hemoglobin 13.7 12.0 - 15.0 g/dL   HCT 32.9 51.8 - 84.1 %   MCV 91.7 80.0 - 100.0 fL   MCH 30.6 26.0 - 34.0 pg   MCHC 33.4 30.0 - 36.0 g/dL   RDW 66.0 63.0 - 16.0 %   Platelet Count 483 (H) 150 - 400 K/uL   nRBC 0.0 0.0 - 0.2 %   Neutrophils Relative % 69 %   Neutro Abs 8.6 (H) 1.7 - 7.7 K/uL   Lymphocytes Relative 24 %   Lymphs Abs 3.0 0.7 - 4.0 K/uL   Monocytes Relative 6 %   Monocytes Absolute 0.7 0.1 - 1.0 K/uL   Eosinophils Relative 1 %   Eosinophils Absolute 0.2 0.0 - 0.5 K/uL   Basophils Relative 0 %   Basophils Absolute 0.0 0.0 - 0.1 K/uL   Immature Granulocytes 0 %   Abs Immature Granulocytes 0.05 0.00 - 0.07 K/uL    RADIOGRAPHIC STUDIES:  No pertinent imaging studies available to review.  Orders Placed This  Encounter  Procedures   CBC with Differential (Cancer Center Only)    Standing Status:   Future    Number of Occurrences:   1    Expiration Date:   07/02/2024   CMP (Cancer Center only)    Standing Status:   Future    Number of Occurrences:   1    Expiration Date:   07/02/2024   Lactate dehydrogenase    Standing Status:   Future    Number of Occurrences:   1    Expiration Date:   07/02/2024   Sedimentation rate    Standing Status:   Future    Number of Occurrences:   1    Expiration Date:   07/02/2024   C-reactive protein    Standing Status:   Future    Number of Occurrences:   1    Expiration Date:   07/02/2024   Flow Cytometry, Peripheral Blood (Oncology)    Standing Status:   Future    Number of Occurrences:   1    Expiration Date:   07/02/2024   BCR-ABL1 FISH    Standing Status:   Future    Number of Occurrences:   1    Expiration Date:   07/02/2024  JAK2 V617F rfx CALR/MPL/E12-15    Standing Status:   Future    Number of Occurrences:   1    Expiration Date:   07/02/2024   Iron and TIBC    Standing Status:   Future    Number of Occurrences:   1    Expiration Date:   07/02/2024   Ferritin    Standing Status:   Future    Number of Occurrences:   1    Expiration Date:   07/02/2024    Future Appointments  Date Time Provider Department Center  07/17/2023  3:45 PM Anastazia Creek, Archie Patten, MD [facility] None  10/09/2023 10:15 AM [facility] PHLEBOTOMIST [facility] None  10/09/2023 10:30 AM Corbyn Steedman, Archie Patten, MD [facility] None  06/04/2024 10:00 AM Jarold Motto, PA [facility]    I spent a total of 55 minutes during this encounter with the patient including review of chart and various tests results, discussions about plan of care and coordination of care plan.  This document was completed utilizing speech recognition software. Grammatical errors, random word insertions, pronoun errors, and incomplete sentences are an occasional consequence of this system due to software limitations, ambient noise, and  hardware issues. Any formal questions or concerns about the content, text or information contained within the body of this dictation should be directly addressed to the provider for clarification.

## 2023-07-03 NOTE — Progress Notes (Signed)
 Two bags of food given

## 2023-07-03 NOTE — Assessment & Plan Note (Signed)
 Chronic elevated white blood cell count, most recent count at 12,500. Differential diagnosis includes chronic inflammation, smoking, and potential bone marrow disorders.   We will check BCR/ABL 1 and flow cytometry of peripheral blood to rule out primary bone marrow disorders.  - Advise smoking reduction to help lower white count

## 2023-07-03 NOTE — Assessment & Plan Note (Addendum)
 Chronic elevated platelet count since 2021, with the most recent count in January 2025 at 557,000.   Differential diagnosis includes myeloproliferative neoplasm (MPN) such as essential thrombocytosis, iron deficiency, and secondary causes like inflammation or smoking. Discussed potential JAK2 mutation and associated risks, including increased risk of clotting. Explained hydroxyurea use to keep platelet count below 500,000 and preventing complications if MPN is confirmed. Discussed future risks of leukemias and myelofibrosis with JAK2 mutation.   Labs today revealed elevated platelet count of 483,000, but it is improved compared to prior.  Ferritin is normal at 21, although borderline low.  Rest of the iron studies are pending.  Hemoglobin 13.7, normal.  White count stable at 12,600 with normal differential.  Will proceed with JAK2 testing with reflex analysis for CALR, MPL, exon 12-15 mutations.  Bone marrow biopsy will be considered depending on these results.  - Consider IV iron if iron deficiency is confirmed  - Schedule follow-up phone call in two weeks to discuss results  - Plan in-person follow-up in three months

## 2023-07-04 ENCOUNTER — Encounter: Payer: Self-pay | Admitting: Oncology

## 2023-07-04 LAB — SURGICAL PATHOLOGY

## 2023-07-06 LAB — BCR-ABL1 FISH
Cells Analyzed: 200
Cells Counted: 200

## 2023-07-10 LAB — JAK2 V617F RFX CALR/MPL/E12-15

## 2023-07-10 LAB — FLOW CYTOMETRY

## 2023-07-10 LAB — CALR +MPL + E12-E15  (REFLEX)

## 2023-07-12 ENCOUNTER — Inpatient Hospital Stay

## 2023-07-12 VITALS — BP 155/84 | HR 75 | Temp 98.5°F | Resp 18 | Ht 63.0 in | Wt 181.5 lb

## 2023-07-12 DIAGNOSIS — D75839 Thrombocytosis, unspecified: Secondary | ICD-10-CM | POA: Diagnosis not present

## 2023-07-12 DIAGNOSIS — F17290 Nicotine dependence, other tobacco product, uncomplicated: Secondary | ICD-10-CM | POA: Diagnosis not present

## 2023-07-12 DIAGNOSIS — E611 Iron deficiency: Secondary | ICD-10-CM

## 2023-07-12 DIAGNOSIS — D72829 Elevated white blood cell count, unspecified: Secondary | ICD-10-CM | POA: Diagnosis not present

## 2023-07-12 DIAGNOSIS — Z79899 Other long term (current) drug therapy: Secondary | ICD-10-CM | POA: Diagnosis not present

## 2023-07-12 MED ORDER — DIPHENHYDRAMINE HCL 25 MG PO CAPS
25.0000 mg | ORAL_CAPSULE | Freq: Once | ORAL | Status: AC
  Administered 2023-07-12: 25 mg via ORAL
  Filled 2023-07-12: qty 1

## 2023-07-12 MED ORDER — ACETAMINOPHEN 325 MG PO TABS
650.0000 mg | ORAL_TABLET | Freq: Once | ORAL | Status: AC
Start: 2023-07-12 — End: 2023-07-12
  Administered 2023-07-12: 650 mg via ORAL
  Filled 2023-07-12: qty 2

## 2023-07-12 MED ORDER — SODIUM CHLORIDE 0.9 % IV SOLN: INTRAVENOUS | Status: DC

## 2023-07-12 MED ORDER — SODIUM CHLORIDE 0.9 % IV SOLN
510.0000 mg | Freq: Once | INTRAVENOUS | Status: AC
  Administered 2023-07-12: 510 mg via INTRAVENOUS
  Filled 2023-07-12: qty 510

## 2023-07-12 NOTE — Patient Instructions (Signed)

## 2023-07-17 ENCOUNTER — Encounter: Payer: Self-pay | Admitting: Oncology

## 2023-07-17 ENCOUNTER — Inpatient Hospital Stay (HOSPITAL_BASED_OUTPATIENT_CLINIC_OR_DEPARTMENT_OTHER): Admitting: Oncology

## 2023-07-17 DIAGNOSIS — M7918 Myalgia, other site: Secondary | ICD-10-CM

## 2023-07-17 DIAGNOSIS — D75839 Thrombocytosis, unspecified: Secondary | ICD-10-CM

## 2023-07-17 DIAGNOSIS — J069 Acute upper respiratory infection, unspecified: Secondary | ICD-10-CM | POA: Diagnosis not present

## 2023-07-17 DIAGNOSIS — D72829 Elevated white blood cell count, unspecified: Secondary | ICD-10-CM

## 2023-07-17 MED ORDER — AMOXICILLIN-POT CLAVULANATE 875-125 MG PO TABS: 1.0000 | ORAL_TABLET | Freq: Two times a day (BID) | ORAL | 0 refills | Status: DC

## 2023-07-17 NOTE — Assessment & Plan Note (Signed)
 Experiences musculoskeletal pain in the back, shoulders, and hips, exacerbated by normal activities. Pain may be related to mild chronic inflammation or underlying arthritis. Previous imaging showed degenerative disc disease and hip arthritis. Currently on gabapentin, which may not provide adequate relief. Physical therapy was considered but is financially burdensome.  - Consider increasing gabapentin dosage or exploring alternative pain management options.  Elevated inflammatory markers, with a sed rate of 38 and CRP of 1.2, indicate mild chronic inflammation, possibly contributing to musculoskeletal pain. Cigar smoking may play a role. Previous tests for rheumatoid arthritis were negative. Sed rate has decreased from 76 last year, suggesting improvement.  - Add ANA test to the next set of labs to check for myositis or other inflammatory conditions.

## 2023-07-17 NOTE — Assessment & Plan Note (Signed)
 Chronic elevated white blood cell count, most recent count at 12,500. Differential diagnosis includes chronic inflammation, smoking, and potential bone marrow disorders.    BCR/ABL 1 was negative.  Flow cytometry of peripheral blood was grossly unremarkable.  No evidence of primary myeloproliferative neoplasm.  - Advise smoking reduction to help lower white count

## 2023-07-17 NOTE — Progress Notes (Signed)
 [facility]  HEMATOLOGY-ONCOLOGY ELECTRONIC VISIT PROGRESS NOTE  PATIENT NAME: Emily [name]   MR#: [id number] DOB: 05-15-1975  DATE OF SERVICE: 07/17/2023  Patient Care Team: Jarold Motto, [place] as PCP - General (Physician Assistant)  I connected with the patient via telephone conference and verified that I am speaking with the correct person using two identifiers. The patient's location is at home and I am providing care from the [facility].  I discussed the limitations, risks, security and privacy concerns of performing an evaluation and management service by e-visits and the availability of in person appointments.  I also discussed with the patient that there may be a patient responsible charge related to this service. The patient expressed understanding and agreed to proceed.   ASSESSMENT & PLAN:   Emily [name] is a 48 y.o.  lady with a past medical history of ADHD, bipolar disorder, GERD, dyslipidemia, nicotine dependence, was referred to our service in March 2025 for evaluation of leukocytosis and thrombocytosis.    Thrombocytosis Chronic elevated platelet count since 2021, with the most recent count in January 2025 at 557,000.   On her initial consultation with [place] on 07/03/2023, labs showed elevated platelet count of 483,000, but it is improved compared to prior.  Ferritin was normal at 21, although borderline low.  Iron studies showed evidence of iron deficiency with iron saturation of 5%, iron decreased at 22. Hemoglobin 13.7, normal.  White count stable at 12,600 with normal differential.  ESR was elevated at 38 mm/h, CRP was also mildly elevated at 1.2 mg/dL.  JAK2 testing followed by reflex testing for CALR, MPL, exon 12-15 mutations were all negative.  BCR/ABL 1 was negative.  Flow cytometry of peripheral blood was grossly unremarkable.  No evidence of primary myeloproliferative neoplasm.  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   Leukocytosis is also reactionary to cigarette smoking.   She was started on IV iron with Feraheme x 2 doses, 1 week apart.   - Advised smoking reduction to help lower white count.   - Plan in-person follow-up in three months  Leukocytosis Chronic elevated white blood cell count, most recent count at 12,500. Differential diagnosis includes chronic inflammation, smoking, and potential bone marrow disorders.    BCR/ABL 1 was negative.  Flow cytometry of peripheral blood was grossly unremarkable.  No evidence of primary myeloproliferative neoplasm.  - Advise smoking reduction to help lower white count  Upper respiratory tract infection Symptoms of congestion, muscle aches, runny nose, productive cough, sinus stuffiness, and fatigue following an iron infusion are consistent with a viral upper respiratory infection rather than a reaction to the infusion. A similar episode occurred a few weeks ago, treated with Augmentin after a Z-Pak was ineffective. - Send prescription for Augmentin to [facility] on [address]. She can start taking it if symptoms do not improve in a day or two.  Musculoskeletal pain Experiences musculoskeletal pain in the back, shoulders, and hips, exacerbated by normal activities. Pain may be related to mild chronic inflammation or underlying arthritis. Previous imaging showed degenerative disc disease and hip arthritis. Currently on gabapentin, which may not provide adequate relief. Physical therapy was considered but is financially burdensome.  - Consider increasing gabapentin dosage or exploring alternative pain management options.  Elevated inflammatory markers, with a sed rate of 38 and CRP of 1.2, indicate mild chronic inflammation, possibly contributing to musculoskeletal pain. Cigar smoking may play a role. Previous tests for rheumatoid arthritis were negative. Sed rate has  decreased from 76 last year, suggesting improvement.  - Add ANA test to the next set of labs to  check for myositis or other inflammatory conditions.   I discussed the assessment and treatment plan with the patient. The patient was provided an opportunity to ask questions and all were answered. The patient agreed with the plan and demonstrated an understanding of the instructions. The patient was advised to call back or seek an in-person evaluation if the symptoms worsen or if the condition fails to improve as anticipated.    I spent 22 minutes over the phone with the patient reviewing test results, discuss management and coordination/planning of care.  Meryl Crutch, MD 07/17/2023 4:37 PM [facility] - A DEPT OF [facility] [address] [place] [address] Dept: [phone] Dept Fax: [phone]   INTERVAL HISTORY:  Please see above for problem oriented charting.  The purpose of today's discussion is to explain recent lab results and to formulate plan of care.  She reports symptoms of a cold that started after her recent iron infusion. She describes feeling like a 'statue trying to wake up' with muscle aches and a tickle in her throat that developed into a runny nose, productive cough, and general fatigue. She has been managing her symptoms with Zyrtec, Claritin, and an expectorant. The patient also mentions a previous diagnosis of a severe upper respiratory infection for which she was treated with a week of Augmentin. However, the runny nose and other symptoms never fully resolved.  In addition to the cold symptoms, the patient also reports chronic muscle and joint pain, which she describes as feeling 'achy and tired.' This pain is exacerbated by her current illness and impacts her ability to perform normal daily activities, such as throwing a ball for her dog or cleaning her house. The patient has been managing this pain with gabapentin and stretches, but reports that the gabapentin may not be strong  enough.  The patient also mentions a benign cyst in her uterus, which has been present for several years. She reports having heavy menstrual cycles, which she believes may be contributing to her iron deficiency.  SUMMARY OF HEMATOLOGY HISTORY:  On 05/30/2023, routine labs at her PCPs office showed white count of 12,500 with ANC of 8200, normal differential.  Platelet count was increased at 557,000.  On review of records, she had thrombocytosis dating back to 2021 with platelet count of 478,000-557,000.  She also had chronic leukocytosis with white count ranging between 12,500-17,000 during this timeframe.  Hence a referral was sent to [place] for further evaluation.   She has been tested for rheumatoid arthritis due to the chronic back pain, but the results were negative. She is a smoker, consuming three to four cigars a day, and is currently trying to quit using a nicotine patch. The patient also uses THC-8.  She reports heavy menstrual cycles, which have been a long-term issue.  She is on an estrogen patch for perimenopausal symptoms, including mood swings and hot flashes.   Chronic elevated platelet count since 2021, with the most recent count in January 2025 at 557,000.    On her initial consultation with [place] on 07/03/2023, labs showed elevated platelet count of 483,000, but it is improved compared to prior.  Ferritin was normal at 21, although borderline low.  Iron studies showed evidence of iron deficiency with iron saturation of 5%, iron decreased at 22. Hemoglobin 13.7, normal.  White count  stable at 12,600 with normal differential.  ESR was elevated at 38 mm/h, CRP was also mildly elevated at 1.2 mg/dL.  JAK2 testing followed by reflex testing for CALR, MPL, exon 12-15 mutations were all negative.  BCR/ABL 1 was negative.  Flow cytometry of peripheral blood was grossly unremarkable.  No evidence of primary myeloproliferative neoplasm.  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   Leukocytosis is also reactionary to cigarette smoking.   She was started on IV iron with Feraheme x 2 doses, 1 week apart.   - Advised smoking reduction to help lower white count.   REVIEW OF SYSTEMS:    Review of Systems - Oncology  All other pertinent systems were reviewed with the patient and are negative.  I have reviewed the past medical history, past surgical history, social history and family history with the patient and they are unchanged from previous note.  ALLERGIES:  She has no known allergies.  MEDICATIONS:  Current Outpatient Medications  Medication Sig Dispense Refill   amoxicillin-clavulanate (AUGMENTIN) 875-125 MG tablet Take 1 tablet by mouth 2 (two) times daily. 20 tablet 0   amphetamine-dextroamphetamine (ADDERALL) 10 MG tablet 10 mg.     estradiol (VIVELLE-DOT) 0.05 MG/24HR patch Place 1 patch onto the skin 2 (two) times a week.     fluticasone (FLONASE) 50 MCG/ACT nasal spray Place 2 sprays into both nostrils daily. 16 g 6   gabapentin (NEURONTIN) 300 MG capsule TAKE 1 CAPSULE(300 MG) BY MOUTH THREE TIMES DAILY 90 capsule 2   nicotine (NICODERM CQ - DOSED IN MG/24 HR) 7 mg/24hr patch Place 1 patch (7 mg total) onto the skin daily. 28 patch 0   progesterone (PROMETRIUM) 100 MG capsule Take 100 mg by mouth daily.     traZODone (DESYREL) 100 MG tablet TAKE 1 TABLET(100 MG) BY MOUTH AT BEDTIME 30 tablet 2   No current facility-administered medications for this visit.    PHYSICAL EXAMINATION:   Onc Performance Status - 07/17/23 1500       ECOG Perf Status   ECOG Perf Status Fully active, able to carry on all pre-disease performance without restriction      KPS SCALE   KPS % SCORE Normal, no compliants, no evidence of disease             LABORATORY DATA:   I have reviewed the data as listed.  Recent Results (from the past 2160 hours)  POCT Influenza A/B     Status: Normal   Collection Time: 05/23/23  8:45 AM  Result Value Ref Range   Influenza  A, POC Negative Negative   Influenza B, POC Negative Negative  POC COVID-19     Status: Normal   Collection Time: 05/23/23  8:46 AM  Result Value Ref Range   SARS Coronavirus 2 Ag Negative Negative  CBC with Differential/Platelet     Status: Abnormal   Collection Time: 05/30/23 11:08 AM  Result Value Ref Range   WBC 12.5 (H) 4.0 - 10.5 K/uL   RBC 4.49 3.87 - 5.11 Mil/uL   Hemoglobin 13.5 12.0 - 15.0 g/dL   HCT 16.1 09.6 - 04.5 %   MCV 90.5 78.0 - 100.0 fl   MCHC 33.1 30.0 - 36.0 g/dL   RDW 40.9 81.1 - 91.4 %   Platelets 557.0 (H) 150.0 - 400.0 K/uL   Neutrophils Relative % 65.6 43.0 - 77.0 %   Lymphocytes Relative 26.1 12.0 - 46.0 %   Monocytes Relative 5.5 3.0 - 12.0 %  Eosinophils Relative 2.3 0.0 - 5.0 %   Basophils Relative 0.5 0.0 - 3.0 %   Neutro Abs 8.2 (H) 1.4 - 7.7 K/uL   Lymphs Abs 3.3 0.7 - 4.0 K/uL   Monocytes Absolute 0.7 0.1 - 1.0 K/uL   Eosinophils Absolute 0.3 0.0 - 0.7 K/uL   Basophils Absolute 0.1 0.0 - 0.1 K/uL  Comprehensive metabolic panel     Status: None   Collection Time: 05/30/23 11:08 AM  Result Value Ref Range   Sodium 137 135 - 145 mEq/L   Potassium 4.0 3.5 - 5.1 mEq/L   Chloride 102 96 - 112 mEq/L   CO2 31 19 - 32 mEq/L   Glucose, Bld 76 70 - 99 mg/dL   BUN 13 6 - 23 mg/dL   Creatinine, Ser 9.56 0.40 - 1.20 mg/dL   Total Bilirubin 0.3 0.2 - 1.2 mg/dL   Alkaline Phosphatase 84 39 - 117 U/L   AST 15 0 - 37 U/L   ALT 10 0 - 35 U/L   Total Protein 7.4 6.0 - 8.3 g/dL   Albumin 4.2 3.5 - 5.2 g/dL   GFR 387.56 >43.32 mL/min    Comment: Calculated using the CKD-EPI Creatinine Equation (2021)   Calcium 9.8 8.4 - 10.5 mg/dL  Lipid panel     Status: Abnormal   Collection Time: 05/30/23 11:08 AM  Result Value Ref Range   Cholesterol 208 (H) 0 - 200 mg/dL    Comment: ATP III Classification       Desirable:  < 200 mg/dL               Borderline High:  200 - 239 mg/dL          High:  > = 951 mg/dL   Triglycerides 884.1 (H) 0.0 - 149.0 mg/dL     Comment: Normal:  <660 mg/dLBorderline High:  150 - 199 mg/dL   HDL 63.01 >60.10 mg/dL   VLDL 93.2 (H) 0.0 - 35.5 mg/dL   LDL Cholesterol 732 (H) 0 - 99 mg/dL   Total CHOL/HDL Ratio 5     Comment:                Men          Women1/2 Average Risk     3.4          3.3Average Risk          5.0          4.42X Average Risk          9.6          7.13X Average Risk          15.0          11.0                       NonHDL 165.35     Comment: NOTE:  Non-HDL goal should be 30 mg/dL higher than patient's LDL goal (i.e. LDL goal of < 70 mg/dL, would have non-HDL goal of < 100 mg/dL)  HIV Antibody (routine testing w rflx)     Status: None   Collection Time: 05/30/23 11:08 AM  Result Value Ref Range   HIV 1&2 Ab, 4th Generation NON-REACTIVE NON-REACTIVE    Comment: HIV-1 antigen and HIV-1/HIV-2 antibodies were not detected. There is no laboratory evidence of HIV infection. Marland Kitchen PLEASE NOTE: This information has been disclosed to you from records whose confidentiality may be protected by state  law.  If your state requires such protection, then the state law prohibits you from making any further disclosure of the information without the specific written consent of the person to whom it pertains, or as otherwise permitted by law. A general authorization for the release of medical or other information is NOT sufficient for this purpose. . For additional information please refer to http://education.questdiagnostics.com/faq/FAQ106 (This link is being provided for informational/ educational purposes only.) . Marland Kitchen The performance of this assay has not been clinically validated in patients less than 49 years old. .   Hepatitis C antibody     Status: None   Collection Time: 05/30/23 11:08 AM  Result Value Ref Range   Hepatitis C Ab NON-REACTIVE NON-REACTIVE    Comment: . HCV antibody was non-reactive. There is no laboratory  evidence of HCV infection. . In most cases, no further action is required.  However, if recent HCV exposure is suspected, a test for HCV RNA (test code [address]) is suggested. . For additional information please refer to http://education.questdiagnostics.com/faq/FAQ22v1 (This link is being provided for informational/ educational purposes only.) .   HgB A1c     Status: None   Collection Time: 05/30/23 11:08 AM  Result Value Ref Range   Hgb A1c MFr Bld 6.5 4.6 - 6.5 %    Comment: Glycemic Control Guidelines for People with Diabetes:Non Diabetic:  <6%Goal of Therapy: <7%Additional Action Suggested:  >8%   Vitamin B12     Status: Abnormal   Collection Time: 05/30/23 11:08 AM  Result Value Ref Range   Vitamin B-12 1,454 (H) 211 - 911 pg/mL  VITAMIN D 25 Hydroxy (Vit-D Deficiency, Fractures)     Status: None   Collection Time: 05/30/23 11:08 AM  Result Value Ref Range   VITD 40.19 30.00 - 100.00 ng/mL  Surgical pathology     Status: None   Collection Time: 07/03/23 12:00 AM  Result Value Ref Range   SURGICAL PATHOLOGY      Surgical Pathology CASE: [id number] PATIENT: Ariaunna Sforza Flow Pathology Report     Clinical history: Leukocytosis, unspecified     DIAGNOSIS:  -No monoclonal B-cell population or significant T-cell abnormalities identified.   GATING AND PHENOTYPIC ANALYSIS:  Gated population: Flow cytometric immunophenotyping is performed using antibodies to the antigens listed in the table below. Electronic gates are placed around a cell cluster displaying light scatter properties corresponding to: lymphocytes  Abnormal Cells in gated population: N/A  Phenotype of Abnormal Cells: N/A                      Lymphoid Antigens       Myeloid Antigens Miscellaneous CD2  tested    CD10 tested    CD11b     ND   CD45 tested CD3  tested    CD19 tested    CD11c     ND   HLA-Dr    ND CD4  tested    CD20 tested    CD13 ND   CD34 tested CD5  tested    CD22 ND   CD14 ND   CD38 tested CD7  tested    CD79b     ND   CD15 ND   CD138     ND CD8   tested    CD103     ND   CD16 ND    TdT  ND CD25 ND   CD200     tested    CD33 ND   CD123  ND TCRab     ND   sKappa    tested    CD64 ND   CD41 ND TCRgd     tested    sLambda   tested    CD117     ND   CD61 ND CD56 tested    cKappa    ND   MPO  ND   CD71 ND CD57 ND   cLambda   ND        CD235aND       GROSS DESCRIPTION:  One lavender top tube submitted from [facility] for lymphoma testing.    Final Diagnosis performed by Guerry Bruin, MD.   Electronically signed 07/04/2023 Technical and / or Professional components performed at [facility], [address], [place], [place] [address].  The above tests were developed and their performance characteristics determined by the [facility] system for the physical and immunophenotypic characterization of cell populations. They have not been cleared by the U.S. Food and Drug administration. The  FDA has determined that such clearance or approval is not necessary. This test is used for clinical purposes. It should not be  rega rded as investigational or for research   Ferritin     Status: None   Collection Time: 07/03/23 11:30 AM  Result Value Ref Range   Ferritin 21 11 - 307 ng/mL    Comment: Performed at [facility], [address], [place], [place] [address]  Iron and TIBC     Status: Abnormal   Collection Time: 07/03/23 11:30 AM  Result Value Ref Range   Iron 22 (L) 28 - 170 ug/dL   TIBC 098 119 - 147 ug/dL   Saturation Ratios 5 (L) 10.4 - 31.8 %   UIBC 391 ug/dL    Comment: Performed at [facility], [address], [place], [place] [address]  JAK2 V617F rfx CALR/MPL/E12-15     Status: None   Collection Time: 07/03/23 11:30 AM  Result Value Ref Range   Specimen Type Comment:     Comment: NOT PROVIDED   JAK2 V617F Result Comment     Comment: (NOTE) NEGATIVE The JAK2 V617F mutation is not detected in the provided specimen of this individual. Results should be interpreted  in conjunction with clinical and other laboratory findings for the most accurate interpretation. This test was developed and its performance characteristics determined by [facility]. It has not been cleared or approved by the Food and Drug Administration.    Reflex Comment     Comment: (NOTE) Reflex to CALR Mutation Analysis, JAK2 Exon 12-15 Mutation Analysis, and MPL Mutation Analysis is indicated.    V617F Rfx CALR/MPL/E12-15 Bkgd Comment     Comment: (NOTE)    Molecular testing of blood or bone marrow is useful in the evaluation of suspected myeloproliferative neoplasms (MPN). Mutations in the JAK2, MPL, and CALR genes are present in virtually all MPNs and their presence help distinguish benign reactive processes from clonal neoplasms. These mutations are generally considered mutually exclusive, although concurrent clones have been reported in rare patients. This test will assess for the JAK2V617F (exon 14) mutation first and will reflex to CALR mutation analysis, MPL mutation analysis, and JAK2 exon 12 to 15 mutation analysis if the JAK2V617F mutation is negative.    The JAK2 (Janus kinase 2) gene encodes for a non-receptor protein tyrosine kinase that activates cytokine and growth factor signaling. The V617F (c.1849 G>T) mutation results in constitutive activation of JAK2 and downstream STAT5  and ERK signaling. The V617F mutation is observed in approximately 95% of polycythemia vera (PV), 60% of essential thromboc ythemia (ET) and primary myelofibrosis (PMF). It is also infrequently present (3-5%) in myelodysplastic syndrome, chronic myelomonocytic leukemia, and other atypical chronic myeloid disorders. A small percentage of JAK2 mutation positive patients (3.3%) contain other non-V617F mutations within exons 12 to 15. In particular, mutations in exon 12 of JAK2 have been described in approximately 3% of patients with PV. JAK2 allele burden correlates with clinical phenotype,  with low levels of mutant allele characterized by thrombocytosis, intermediate levels with erythrocytosis, and high mutant allele burden correlating with enhanced myelopoiesis of the BM, leukocytosis, increasing spleen size, and circulating CD34-positive cells.    The CALR (Calreticulin) gene encodes for a multifunctional calcium-binding protein involved in many cellular activities such as growth, proliferation, adhesion, and programmed cell death. Among patients with JAK2 negative MPNs, CALR are found in a pproximately 70% of patients with JAK2-negative essential thrombocythemia (ET) and 60-88% of patients with JAK2-negative primary myelofibrosis(PMF). Only a minority of patients (approximately 8%) with myelodysplasia have mutations in the CALR gene. CALR mutations are rarely detected in patients with de novo acute myeloid leukemia, chronic myelogenous leukemia, lymphoid leukemia, or solid tumors. CALR mutations are not detected in polycythemia and generally appear to be mutually exclusive with JAK2 mutations and MPL mutations. The majority of mutational changes involve a variety of insertion deletion mutations in exon 9 of the calreticulin gene: approximately 53% of all CALR mutations are a 52 bp deletion (type-1) while the second most prevalent mutation (approximately 32%) contains a 5 bp insertion (type-2). Other mutations (non-type 1 or type 2) are seen in a small minority of cases. CALR mutations in PMF tend to be with a favorable prognosis compared to JAK2 V617F [employer], whereas primary myelofibrosis negative for CALR, JAK2 V617F and MPL mutations (so-called triple negative) is associated with a poor prognosis and shorter survival.    The MPL (myeloproliferative leukemia virus oncogene) gene encodes the thrombopoietin receptor which regulates hematopoiesis and megakaryopoiesis. Activating MPL mutations are associated with a subset of myeloproliferative neoplasms and  acute megakaryoblastic leukemia. MPL W515 mutations are present in approximately 5-8% of patients with primary myelofibrosis (PMF) and 1-4% of patients with essential thrombocythemia (ET). The S505 mutation is detected in patients with hereditary thrombocythemia.    Limitations    This assay has a sensitivity of approximately 1% VAF for JAK2 V617F, 2.5% VAF for other mutations in JAK2 exons 12 to 15, CALR mutations, and MPL mutations. Deletions in JAK2 up to 6 bp and insertions up to 34 bp have been detected in validation studies. Deletions in CALR up to 70 bp and i nsertions up to 12 bp have been detected in validation studies.    Method based next generation sequencing.     Comment: Comment [employer]    References Comment     Comment: (NOTE) Alghasham N, Alnouri Y, Abalkhail H, Clarita Leber. Detection of mutations in JAK2 exons 12-15 by [employer] sequencing. Int J Lab Hematol. 2016 Feb;38(1):34-41. doi: 10.1111/ijlh.21308. Epub 2015 Sep 11. PMID: 65784696. Pura Spice, Unk Lightning, Hasserjian R, Patrica Duel, Borowitz MJ, Leroy Libman MM, Pap[place]au CD, [place], Vardiman JW. The 2016 revision to the World Health Organization classification of myeloid neoplasms and acute leukemia. Blood. 2016 May 19;127(20):2391-405. doi: 10.1182/blood-2016-03-643544. Epub 2016 Apr 11. PMID: 29528413. Genevie Ann [facility], Zhang ZJ, [place] S, Albitar M. Mutation profile of JAK2 transcripts in patients with chronic myeloproliferative  neoplasias. J Mol Diagn. 2009 Jan;11(1):49-53.doi: 10.2353/jmoldx.2009.080114. Epub 2008 Dec 12. PMID: 16109604; PMCID: [id number]. NCCN Clinical Practice Guidelines in Oncology (NCCN Guidelines) Myeloproliferative Neoplasms Version 3.2022 - December 09, 2020. Swerdlow SH, Programmer, multimedia. WHO classif ication of Tumours of Haematopoietic and Lymphoid Tissues. 4th edn. Jaci Standard, [place]: Geologist, engineering for [employer] on [employer]; 2017. Tefferi A. Primary myelofibrosis: 2021 update  on diagnosis, risk-stratification and management. Am J Hematol. 2021 Jan;96(1):145-162. doi: 10.1002/ajh.26050. Epub 2020 Dec 2. PMID: 78295621. Royetta Car, Kralovics R. Genetic basis and molecular pathophysiology of classical myeloproliferative neoplasms. Blood. 2017 Feb 9;129(6):667-679. doi: 10.1182/blood-2016-10-695940. Epub 2016 Dec 27. PMID: 30865784.    Director Review Comment     Comment: (NOTE) Mellody Life, PhD, [facility]    Director, Molecular Oncology    [facility]    Research [place], [place] [address]    [phone] Performed At: [facility] [address] [place], [place] [id number] Maurine Simmering MDPhD QI:[phone] Performed At: [place] [facility] [address] [place], [place] [id number] Maurine Simmering MDPhD [id number]   BCR-ABL1 FISH     Status: None   Collection Time: 07/03/23 11:30 AM  Result Value Ref Range   Specimen Type BLOOD    Cells Counted 200    Cells Analyzed 200    FISH Result ABL1 GENE FUSION     Comment: Comment: NO BCR    Interpretation Comment:     Comment: (NOTE) NEGATIVE             nuc ish 9q34(ASS1,ABL1)x2,22q11.2(BCRx2)[200].      The fluorescence in situ hybridization (FISH) study was normal. FISH, using unique sequence DNA probes for the ABL1 and BCR gene regions showed two ABL1 signals (red), two control ASS1 gene signals (aqua) located adjacent to the ABL1 locus at 9q34, and two BCR signals (green) at 22q11.2 in all interphase nuclei examined. There was NO evidence of CML or ALL-associated BCR::ABL1 dual fusion signals in this analysis. .      This analysis is limited to abnormalities detectable by the specific probes included in the study. FISH results should be interpreted within the context of a full cytogenetic analysis and pathology evaluation.  A BCR::ABL1 gene fusion in greater than 3 interphase nuclei in a patient with a new clinical diagnosis is considered positive. The DNA  probe vendor for this study was [employer], community). .      This test was developed and its performance characteristics d etermined by [employer] ([employer]). It has not been cleared or approved by the U.S. Food and Drug Administration.    Director Review: Comment:     Comment: (NOTE) Albertina Senegal. Christell Constant, PhD, [facility] Performed At: [facility] [address] [place], [place] [id number] Maurine Simmering MDPhD FT:[id number]   Flow Cytometry, Peripheral Blood (Oncology)     Status: None   Collection Time: 07/03/23 11:30 AM  Result Value Ref Range   Flow Cytometry SEE SEPARATE REPORT     Comment: Performed at [facility], [address] [place], [place] [address]  C-reactive protein     Status: Abnormal   Collection Time: 07/03/23 11:30 AM  Result Value Ref Range   CRP 1.2 (H) <1.0 mg/dL    Comment: Performed at [facility], [address], [place], [place] [address]  Sedimentation rate     Status: Abnormal   Collection Time: 07/03/23 11:30 AM  Result Value Ref Range   Sed  Rate 38 (H) 0 - 22 mm/hr    Comment: Performed at [facility], [address] [place], [place] [address]  Lactate dehydrogenase     Status: None   Collection Time: 07/03/23 11:30 AM  Result Value Ref Range   LDH 145 98 - 192 U/L    Comment: Performed at [facility], [address] [place], [place] [address]  CMP (Cancer Center only)     Status: Abnormal   Collection Time: 07/03/23 11:30 AM  Result Value Ref Range   Sodium 141 135 - 145 mmol/L   Potassium 4.1 3.5 - 5.1 mmol/L   Chloride 104 98 - 111 mmol/L   CO2 30 22 - 32 mmol/L   Glucose, Bld 89 70 - 99 mg/dL    Comment: Glucose reference range applies only to samples taken after fasting for at least 8 hours.   BUN 11 6 - 20 mg/dL   Creatinine 0.98 1.19 - 1.00 mg/dL   Calcium 9.3 8.9 - 14.7 mg/dL   Total Protein 7.0 6.5 - 8.1 g/dL   Albumin 4.2 3.5  - 5.0 g/dL   AST 8 (L) 15 - 41 U/L   ALT 8 0 - 44 U/L   Alkaline Phosphatase 75 38 - 126 U/L   Total Bilirubin 0.3 0.0 - 1.2 mg/dL   GFR, Estimated >82 >95 mL/min    Comment: (NOTE) Calculated using the CKD-EPI Creatinine Equation (2021)    Anion gap 7 5 - 15    Comment: Performed at [facility], [address], [place], [place] [address]  CBC with Differential (Cancer Center Only)     Status: Abnormal   Collection Time: 07/03/23 11:30 AM  Result Value Ref Range   WBC Count 12.6 (H) 4.0 - 10.5 K/uL   RBC 4.47 3.87 - 5.11 MIL/uL   Hemoglobin 13.7 12.0 - 15.0 g/dL   HCT 86.5 78.4 - 69.6 %   MCV 91.7 80.0 - 100.0 fL   MCH 30.6 26.0 - 34.0 pg   MCHC 33.4 30.0 - 36.0 g/dL   RDW 29.5 28.4 - 13.2 %   Platelet Count 483 (H) 150 - 400 K/uL   nRBC 0.0 0.0 - 0.2 %   Neutrophils Relative % 69 %   Neutro Abs 8.6 (H) 1.7 - 7.7 K/uL   Lymphocytes Relative 24 %   Lymphs Abs 3.0 0.7 - 4.0 K/uL   Monocytes Relative 6 %   Monocytes Absolute 0.7 0.1 - 1.0 K/uL   Eosinophils Relative 1 %   Eosinophils Absolute 0.2 0.0 - 0.5 K/uL   Basophils Relative 0 %   Basophils Absolute 0.0 0.0 - 0.1 K/uL   Immature Granulocytes 0 %   Abs Immature Granulocytes 0.05 0.00 - 0.07 K/uL    Comment: Performed at [facility], [address] [place], [place] [address]  CALR +MPL + E12-E15 (reflexed)     Status: None   Collection Time: 07/03/23 11:30 AM  Result Value Ref Range   CALR Result Comment     Comment: (NOTE) NEGATIVE No insertions or deletions were detected within the analyzed region of the calreticulin (CALR) gene. A negative result does not entirely exclude the possibility of a clonal population carrying CALR gene mutations that are not covered by this assay. Results should be interpreted in conjunction with clinical and laboratory findings for the most accurate interpretation.    MPL Result Comment     Comment: (NOTE) NEGATIVE No MPL mutation was  identified  in the provided specimen of this individual. Results should be interpreted in conjunction with clinical and other laboratory findings for the most accurate interpretation.    E12-15 Result Comment     Comment: (NOTE) NEGATIVE    JAK2 mutations were not detected in exons 12, 13, 14 and 15. The G to T nucleotide change encoding the [id number] mutation was not detected. This result does not rule out the presence of JAK2 mutation at a level below the detection sensitivity of this assay, the presence of other mutations outside the analyzed region of the JAK2 gene, or the presence of a myeloproliferative or other neoplasm. Result must be correlated with other clinical data for the most accurate diagnosis. Performed At: [facility] [address] [place], [place] [id number] Maurine Simmering MDPhD WU:[id number]      RADIOGRAPHIC STUDIES:  No recent pertinent imaging studies available to review.  Orders Placed This Encounter  Procedures   ANA w/Reflex if Positive    Standing Status:   Future    Expected Date:   07/19/2023    Expiration Date:   07/16/2024   CBC with Differential (Cancer Center Only)    Standing Status:   Future    Expected Date:   10/09/2023    Expiration Date:   07/16/2024   Ferritin    Standing Status:   Future    Expected Date:   10/09/2023    Expiration Date:   07/16/2024   Iron and TIBC    Standing Status:   Future    Expected Date:   10/09/2023    Expiration Date:   07/16/2024   C-reactive protein    Standing Status:   Future    Expected Date:   10/09/2023    Expiration Date:   07/16/2024   Sedimentation rate    Standing Status:   Future    Expected Date:   10/09/2023    Expiration Date:   07/16/2024     Future Appointments  Date Time Provider Department Center  07/19/2023 12:00 PM [facility] PHLEBOTOMIST [facility] None  07/19/2023 12:45 PM [facility] INFUSION [facility] None  10/09/2023 10:15 AM [facility] PHLEBOTOMIST [facility] None  10/09/2023 10:30 AM  Davison Ohms, Archie Patten, MD [facility] None  06/04/2024 10:00 AM Jarold Motto, PA [facility]    This document was completed utilizing speech recognition software. Grammatical errors, random word insertions, pronoun errors, and incomplete sentences are an occasional consequence of this system due to software limitations, ambient noise, and hardware issues. Any formal questions or concerns about the content, text or information contained within the body of this dictation should be directly addressed to the provider for clarification.

## 2023-07-17 NOTE — Assessment & Plan Note (Signed)
 Chronic elevated platelet count since 2021, with the most recent count in January 2025 at 557,000.   On her initial consultation with [place] on 07/03/2023, labs showed elevated platelet count of 483,000, but it is improved compared to prior.  Ferritin was normal at 21, although borderline low.  Iron studies showed evidence of iron deficiency with iron saturation of 5%, iron decreased at 22. Hemoglobin 13.7, normal.  White count stable at 12,600 with normal differential.  ESR was elevated at 38 mm/h, CRP was also mildly elevated at 1.2 mg/dL.  JAK2 testing followed by reflex testing for CALR, MPL, exon 12-15 mutations were all negative.  BCR/ABL 1 was negative.  Flow cytometry of peripheral blood was grossly unremarkable.  No evidence of primary myeloproliferative neoplasm.  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  Leukocytosis is also reactionary to cigarette smoking.   She was started on IV iron with Feraheme x 2 doses, 1 week apart.   - Advised smoking reduction to help lower white count.   - Plan in-person follow-up in three months

## 2023-07-17 NOTE — Assessment & Plan Note (Signed)
 Symptoms of congestion, muscle aches, runny nose, productive cough, sinus stuffiness, and fatigue following an iron infusion are consistent with a viral upper respiratory infection rather than a reaction to the infusion. A similar episode occurred a few weeks ago, treated with Augmentin after a Z-Pak was ineffective. - Send prescription for Augmentin to [facility] on [address]. She can start taking it if symptoms do not improve in a day or two.

## 2023-07-19 ENCOUNTER — Inpatient Hospital Stay

## 2023-07-19 ENCOUNTER — Ambulatory Visit: Admitting: Physician Assistant

## 2023-07-19 ENCOUNTER — Encounter: Payer: Self-pay | Admitting: Physician Assistant

## 2023-07-19 ENCOUNTER — Ambulatory Visit

## 2023-07-19 VITALS — BP 138/87 | HR 90 | Temp 98.2°F | Resp 17

## 2023-07-19 VITALS — BP 110/70 | HR 91 | Temp 97.6°F | Ht 63.0 in | Wt 176.5 lb

## 2023-07-19 DIAGNOSIS — Z79899 Other long term (current) drug therapy: Secondary | ICD-10-CM | POA: Diagnosis not present

## 2023-07-19 DIAGNOSIS — J302 Other seasonal allergic rhinitis: Secondary | ICD-10-CM | POA: Diagnosis not present

## 2023-07-19 DIAGNOSIS — F17290 Nicotine dependence, other tobacco product, uncomplicated: Secondary | ICD-10-CM | POA: Diagnosis not present

## 2023-07-19 DIAGNOSIS — E611 Iron deficiency: Secondary | ICD-10-CM | POA: Diagnosis not present

## 2023-07-19 DIAGNOSIS — R052 Subacute cough: Secondary | ICD-10-CM | POA: Diagnosis not present

## 2023-07-19 DIAGNOSIS — M2550 Pain in unspecified joint: Secondary | ICD-10-CM | POA: Diagnosis not present

## 2023-07-19 DIAGNOSIS — R059 Cough, unspecified: Secondary | ICD-10-CM | POA: Diagnosis not present

## 2023-07-19 DIAGNOSIS — R5383 Other fatigue: Secondary | ICD-10-CM | POA: Diagnosis not present

## 2023-07-19 DIAGNOSIS — T7840XA Allergy, unspecified, initial encounter: Secondary | ICD-10-CM

## 2023-07-19 DIAGNOSIS — D72829 Elevated white blood cell count, unspecified: Secondary | ICD-10-CM | POA: Diagnosis not present

## 2023-07-19 DIAGNOSIS — D75839 Thrombocytosis, unspecified: Secondary | ICD-10-CM | POA: Diagnosis not present

## 2023-07-19 MED ORDER — SODIUM CHLORIDE 0.9 % IV SOLN
510.0000 mg | Freq: Once | INTRAVENOUS | Status: AC
  Administered 2023-07-19: 510 mg via INTRAVENOUS
  Filled 2023-07-19: qty 510

## 2023-07-19 MED ORDER — NICOTINE 14 MG/24HR TD PT24: 14.0000 mg | MEDICATED_PATCH | Freq: Every day | TRANSDERMAL | 1 refills | Status: DC

## 2023-07-19 MED ORDER — DIPHENHYDRAMINE HCL 25 MG PO CAPS
25.0000 mg | ORAL_CAPSULE | Freq: Once | ORAL | Status: AC
Start: 2023-07-19 — End: 2023-07-19
  Administered 2023-07-19: 25 mg via ORAL
  Filled 2023-07-19: qty 1

## 2023-07-19 MED ORDER — AIRSUPRA 90-80 MCG/ACT IN AERO: 2.0000 | INHALATION_SPRAY | Freq: Four times a day (QID) | RESPIRATORY_TRACT | 1 refills | Status: DC | PRN

## 2023-07-19 MED ORDER — SODIUM CHLORIDE 0.9 % IV SOLN: INTRAVENOUS | Status: DC

## 2023-07-19 MED ORDER — ACETAMINOPHEN 325 MG PO TABS
650.0000 mg | ORAL_TABLET | Freq: Once | ORAL | Status: AC
  Administered 2023-07-19: 650 mg via ORAL
  Filled 2023-07-19: qty 2

## 2023-07-19 NOTE — Patient Instructions (Signed)

## 2023-07-19 NOTE — Addendum Note (Signed)
 Addended by: Haynes Bast on: 07/19/2023 01:33 PM   Modules accepted: Orders

## 2023-07-19 NOTE — Progress Notes (Signed)
 Emily [name] is a 48 y.o. female here for a follow up of a pre-existing problem.  History of Present Illness:   Chief Complaint  Patient presents with  . f/u from Hematology    Pt is here to discuss visit.    HPI  IDA / Elevated Inflammation Markers  Most recent visit with hematology on 3/18. Testing done at that time include: JAK2 testing followed by reflex testing for CALR, MPL, exon 12-15 mutations were all negative. BCR/ABL 1 was negative. Flow cytometry of peripheral blood was grossly unremarkable. No evidence of primary myeloproliferative neoplasm. Ferritin was 21 at that time.  ANA  pending.  It was determined that thrombocytosis seems to be reactionary from iron deficiency and leukocytosis reactionary to cigarette smoking. Subsequently, she was started on IV iron with Feraheme x 2 doses, 1 week apart.   Patient also complained of an upper respiratory infection which she was prescribed an Augmentin course for.  Today, she continues to experience symptoms despite her extensive workup including lupus and leukemia.  She is wondering if her severe allergies are contributing to her current state. Iv iron injection has not improved her pain. She is scheduled to have another injection later today and will undergo ANA testing and ferritin levels.   Patient continues to smoke, she has been using nicotine patches to aid in reducing her intake.   Reports a distant history of marijuana use. Currently using THCa products as an alternative.   Seasonal Allergies Patient states that allergy episodes that are triggering a severe cough, fatigue and SOB. Patient also complains of itchy ears, she states that she often uses air pods often.    She denies using any allergy medication to relief symptoms but has been using Claritin for the past 2 days.  She has also been using Flonase. She is requesting to be placed on an inhaler.  Patient states that she has been experiencing symptoms on and off  since September.  Denies any hemoptysis.   Past Medical History:  Diagnosis Date  . ADHD   . Arthritis 03/07/22   Lumbar  . Bipolar 1 disorder (HCC)   . GERD (gastroesophageal reflux disease)    H/O ULCER  . Healthcare maintenance 07/03/2023  . HLD (hyperlipidemia) 05/25/2021  . Ulcer 25+ yrs ago   stress related     Social History   Tobacco Use  . Smoking status: Every Day    Types: Cigars    Passive exposure: Past  . Smokeless tobacco: Former  Advertising account planner  . Vaping status: Former  Substance Use Topics  . Alcohol use: Yes    Comment: rarely  . Drug use: Yes    Types: Marijuana    Comment: regularly    Past Surgical History:  Procedure Laterality Date  . TUBAL LIGATION    . TUBES IN EARS AS A CHILD    . VAGINAL DELIVERY     x3    Family History  Problem Relation Age of Onset  . Breast cancer Mother   . COPD Mother   . Heart disease Mother   . Hypertension Mother   . Cancer Mother   . Heart attack Father   . Hypertension Father   . Heart disease Father   . Cancer Maternal Aunt   . Cancer Maternal Aunt   . Lupus Maternal Aunt   . Heart disease Maternal Grandmother   . Heart disease Other   . Cancer Other   . Colon cancer Neg Hx   .  Colon polyps Neg Hx   . Esophageal cancer Neg Hx   . Stomach cancer Neg Hx   . Rectal cancer Neg Hx     No Known Allergies  Current Medications:   Current Outpatient Medications:  .  amoxicillin-clavulanate (AUGMENTIN) 875-125 MG tablet, Take 1 tablet by mouth 2 (two) times daily., Disp: 20 tablet, Rfl: 0 .  estradiol (VIVELLE-DOT) 0.05 MG/24HR patch, Place 1 patch onto the skin 2 (two) times a week., Disp: , Rfl:  .  gabapentin (NEURONTIN) 300 MG capsule, TAKE 1 CAPSULE(300 MG) BY MOUTH THREE TIMES DAILY, Disp: 90 capsule, Rfl: 2 .  nicotine (NICODERM CQ) 14 mg/24hr patch, Place 1 patch (14 mg total) onto the skin daily., Disp: 28 patch, Rfl: 1 .  progesterone (PROMETRIUM) 100 MG capsule, Take 100 mg by mouth daily.,  Disp: , Rfl:  .  traZODone (DESYREL) 100 MG tablet, TAKE 1 TABLET(100 MG) BY MOUTH AT BEDTIME, Disp: 30 tablet, Rfl: 2 No current facility-administered medications for this visit.  Facility-Administered Medications Ordered in Other Visits:  .  0.9 %  sodium chloride infusion, , Intravenous, Continuous, Pasam, Avinash, MD, Last Rate: 10 mL/hr at 07/19/23 1254, New Bag at 07/19/23 1254 .  ferumoxytol (FERAHEME) 510 mg in sodium chloride 0.9 % 100 mL IVPB, 510 mg, Intravenous, Once, Pasam, Avinash, MD   Review of Systems:   Review of Systems  Constitutional:  Positive for malaise/fatigue.  Respiratory:  Positive for cough and shortness of breath. Negative for hemoptysis.   Negative unless otherwise specified per HPI.  Vitals:   Vitals:   07/19/23 1015  BP: 110/70  Pulse: 91  Temp: 97.6 F (36.4 C)  TempSrc: Temporal  SpO2: 94%  Weight: 176 lb 8 oz (80.1 kg)  Height: 5\' 3"  (1.6 m)     Body mass index is 31.27 kg/m.  Physical Exam:   Physical Exam Vitals and nursing note reviewed.  Constitutional:      General: She is not in acute distress.    Appearance: She is well-developed. She is not ill-appearing or toxic-appearing.  HENT:     Head: Normocephalic and atraumatic.     Right Ear: Tympanic membrane, ear canal and external ear normal. Tympanic membrane is not erythematous, retracted or bulging.     Left Ear: Tympanic membrane, ear canal and external ear normal. Tympanic membrane is not erythematous, retracted or bulging.     Nose: Nose normal.     Right Sinus: No maxillary sinus tenderness or frontal sinus tenderness.     Left Sinus: No maxillary sinus tenderness or frontal sinus tenderness.     Mouth/Throat:     Pharynx: Uvula midline. No posterior oropharyngeal erythema.  Eyes:     General: Lids are normal.     Conjunctiva/sclera: Conjunctivae normal.  Neck:     Trachea: Trachea normal.  Cardiovascular:     Rate and Rhythm: Normal rate and regular rhythm.      Pulses: Normal pulses.     Heart sounds: Normal heart sounds, S1 normal and S2 normal.  Pulmonary:     Effort: Pulmonary effort is normal.     Breath sounds: Normal breath sounds. No decreased breath sounds, wheezing, rhonchi or rales.  Lymphadenopathy:     Cervical: No cervical adenopathy.  Skin:    General: Skin is warm and dry.  Neurological:     Mental Status: She is alert.     GCS: GCS eye subscore is 4. GCS verbal subscore is 5. GCS motor subscore  is 6.  Psychiatric:        Speech: Speech normal.        Behavior: Behavior normal. Behavior is cooperative.     Assessment and Plan:   Subacute cough; Allergy, initial encounter No red flags on exam.   I am going to order a chest x-ray given duration of symptoms, ongoing fatigue and history of smoking Recommend over-the-counter antihistamine, saline nasal spray, continued Flonase Discussed taking medications as prescribed.  Reviewed return precautions including new or worsening fever, SOB, new or worsening cough or other concerns.  Push fluids and rest.  I recommend that patient follow-up if symptoms worsen or persist despite treatment x 7-10 days, sooner if needed.  Arthralgia, unspecified joint; Fatigue, unspecified type Discussed close follow-up with Dr. Jean Rosenthal or consideration of chiropractic care with [facility] Recommend ongoing follow-up with hematology for management of iron deficiency anemia She is requesting further evaluation of symptoms from rheumatology, I will order this for patient today   Jarold Motto, PA-C  I,Safa M Kadhim,acting as a scribe for Jarold Motto, PA.,have documented all relevant documentation on the behalf of Jarold Motto, PA,as directed by  Jarold Motto, PA while in the presence of Jarold Motto, [place].   I, Jarold Motto, [place], have reviewed all documentation for this visit. The documentation on 07/19/23 for the exam, diagnosis, procedures, and orders are all accurate and  complete.

## 2023-07-19 NOTE — Patient Instructions (Addendum)
 It was great to see you!  Start regular use of over the counter antihistamines such as Zyrtec (cetirizine), Claritin (loratadine), Allegra (fexofenadine), or Xyzal (levocetirizine) daily as needed.  Start Nasal saline spray (i.e., Simply Saline) or nasal saline lavage (i.e., NeilMed) is recommended as needed and prior to medicated nasal sprays. Continue Flonase 1 spray twice a day.   See Dr Jean Rosenthal or go to [facility]  Rheumatology referral  Nicotine patches sent  Take care,  Jarold Motto PA-C

## 2023-07-20 LAB — ANA W/REFLEX IF POSITIVE: Anti Nuclear Antibody (ANA): NEGATIVE

## 2023-07-23 ENCOUNTER — Telehealth: Payer: Self-pay | Admitting: *Deleted

## 2023-07-23 NOTE — Telephone Encounter (Signed)
 Copied from CRM [id number]. Topic: General - Other >> Jul 23, 2023 10:05 AM [place] A wrote: Reason for CRM: [facility] would like labs and office notes for patient Fax number [phone]

## 2023-07-23 NOTE — Telephone Encounter (Signed)
 Office notes and labs faxed to [facility].

## 2023-08-01 ENCOUNTER — Encounter: Payer: Self-pay | Admitting: Physician Assistant

## 2023-08-15 DIAGNOSIS — Z113 Encounter for screening for infections with a predominantly sexual mode of transmission: Secondary | ICD-10-CM | POA: Diagnosis not present

## 2023-08-15 DIAGNOSIS — R87610 Atypical squamous cells of undetermined significance on cytologic smear of cervix (ASC-US): Secondary | ICD-10-CM | POA: Diagnosis not present

## 2023-08-15 DIAGNOSIS — N760 Acute vaginitis: Secondary | ICD-10-CM | POA: Diagnosis not present

## 2023-08-15 DIAGNOSIS — Z6832 Body mass index (BMI) 32.0-32.9, adult: Secondary | ICD-10-CM | POA: Diagnosis not present

## 2023-08-15 DIAGNOSIS — Z01419 Encounter for gynecological examination (general) (routine) without abnormal findings: Secondary | ICD-10-CM | POA: Diagnosis not present

## 2023-08-22 ENCOUNTER — Encounter: Payer: Self-pay | Admitting: *Deleted

## 2023-08-31 ENCOUNTER — Other Ambulatory Visit: Payer: Self-pay | Admitting: Physician Assistant

## 2023-09-16 ENCOUNTER — Other Ambulatory Visit: Payer: Self-pay | Admitting: Sports Medicine

## 2023-10-05 ENCOUNTER — Telehealth: Payer: Self-pay | Admitting: Oncology

## 2023-10-05 NOTE — Telephone Encounter (Signed)
 Pt called to cancel 6/10 lab appt and provider visit w/ Dr.Pasam because of insurance

## 2023-10-09 ENCOUNTER — Other Ambulatory Visit

## 2023-10-09 ENCOUNTER — Ambulatory Visit: Admitting: Oncology

## 2024-01-05 IMAGING — MG MM DIGITAL SCREENING BILAT W/ TOMO AND CAD
8 series · 8 of 24 positions shown · non-contrast
Comparison: None.

CLINICAL DATA: Screening.

EXAM:
DIGITAL SCREENING BILATERAL MAMMOGRAM WITH TOMOSYNTHESIS AND CAD
TECHNIQUE: Bilateral screening digital craniocaudal and mediolateral oblique
mammograms were obtained. Bilateral screening digital breast
tomosynthesis was performed. The images were evaluated with
computer-aided detection.

[L MLO synth-2D]
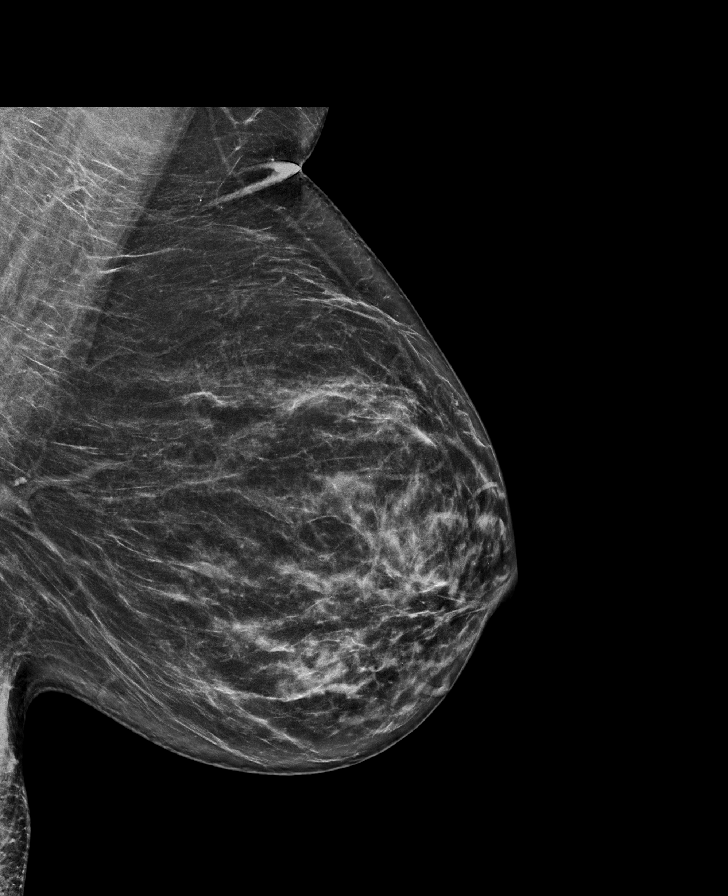

[R MLO synth-2D]
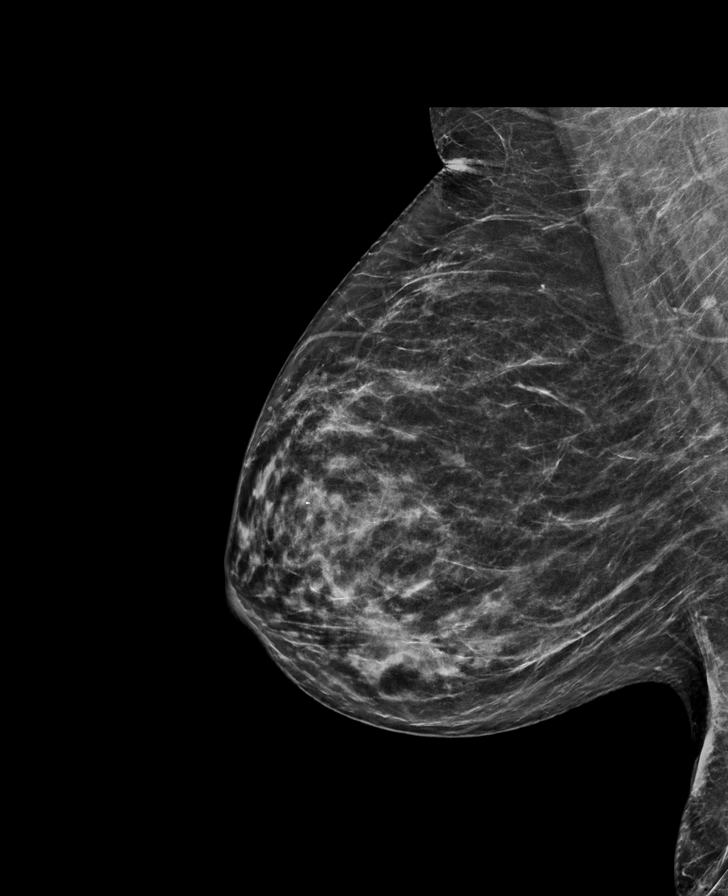

[R CC synth-2D]
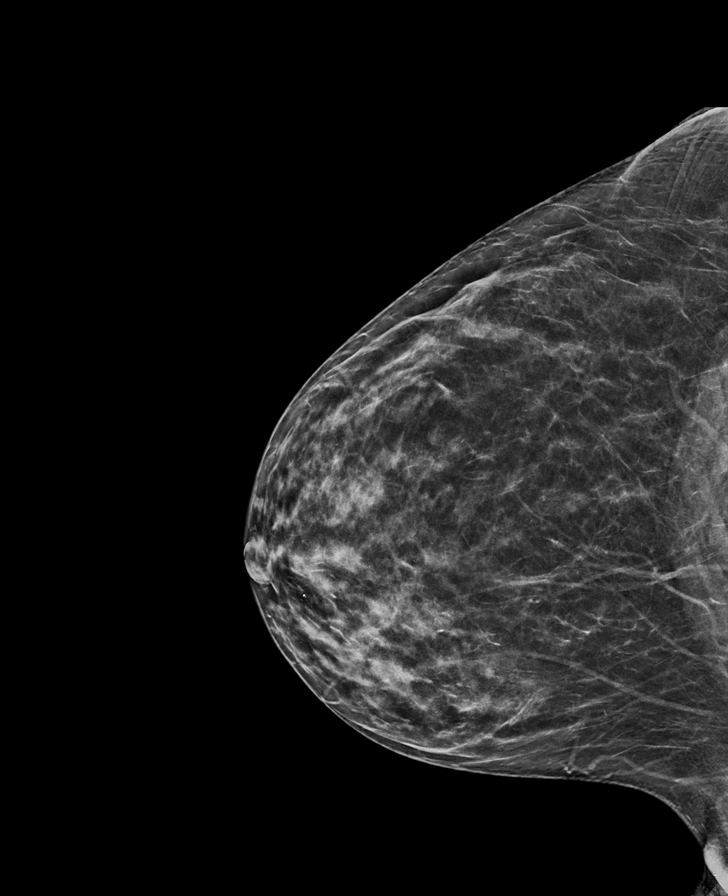

[L CC synth-2D]
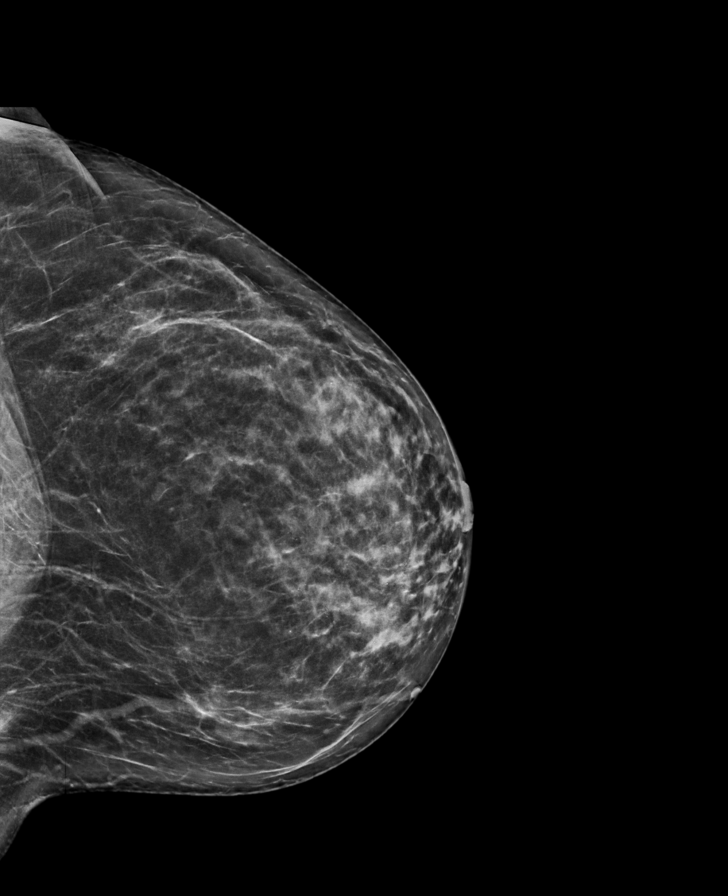

[R CC tomo · tomo slice 33/66.0]
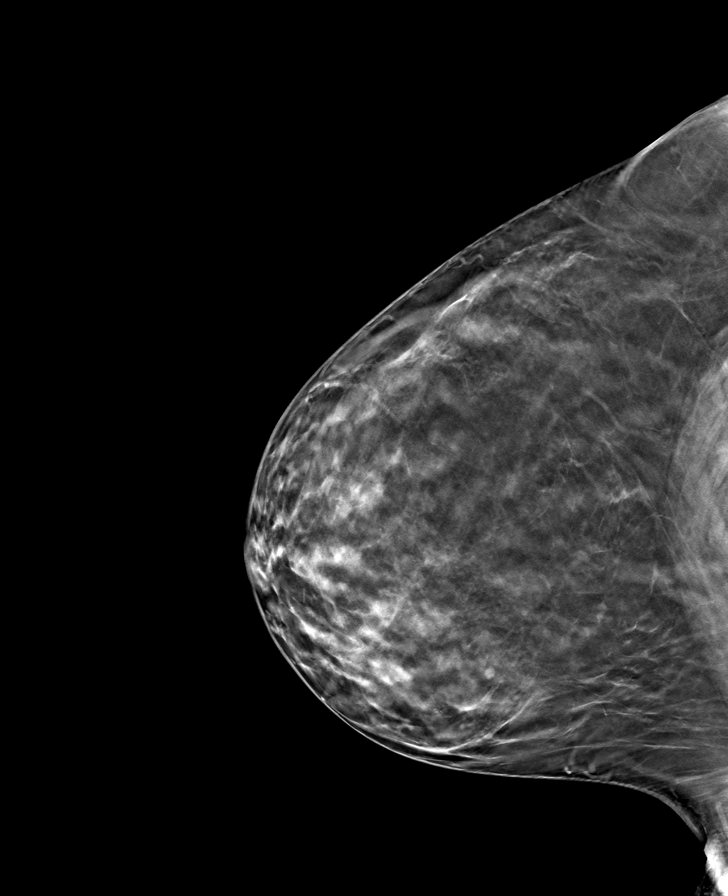

[R MLO tomo · tomo slice 35/70.0]
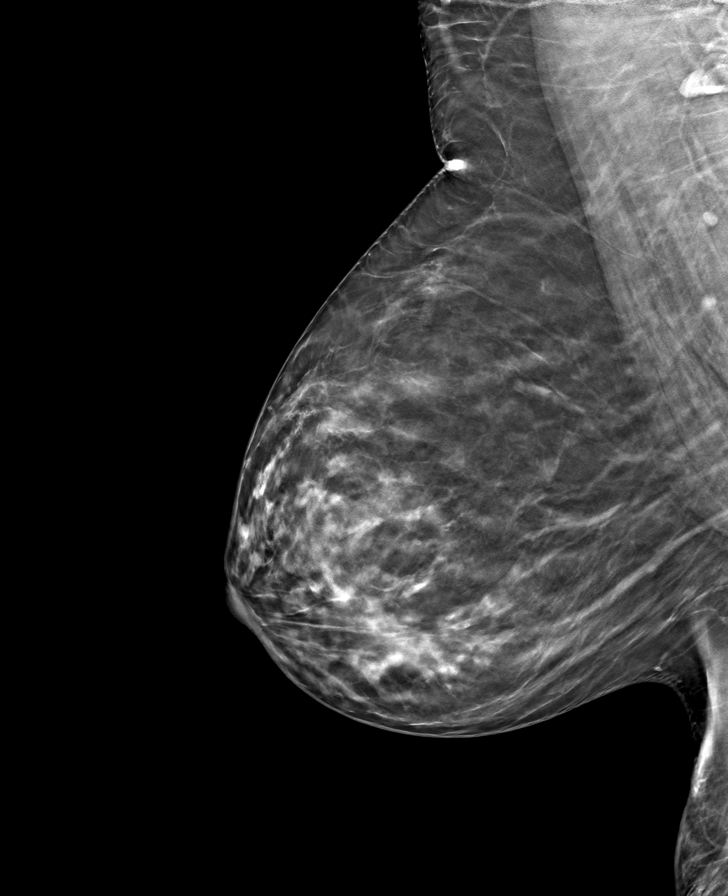

[L MLO tomo · tomo slice 37/74.0]
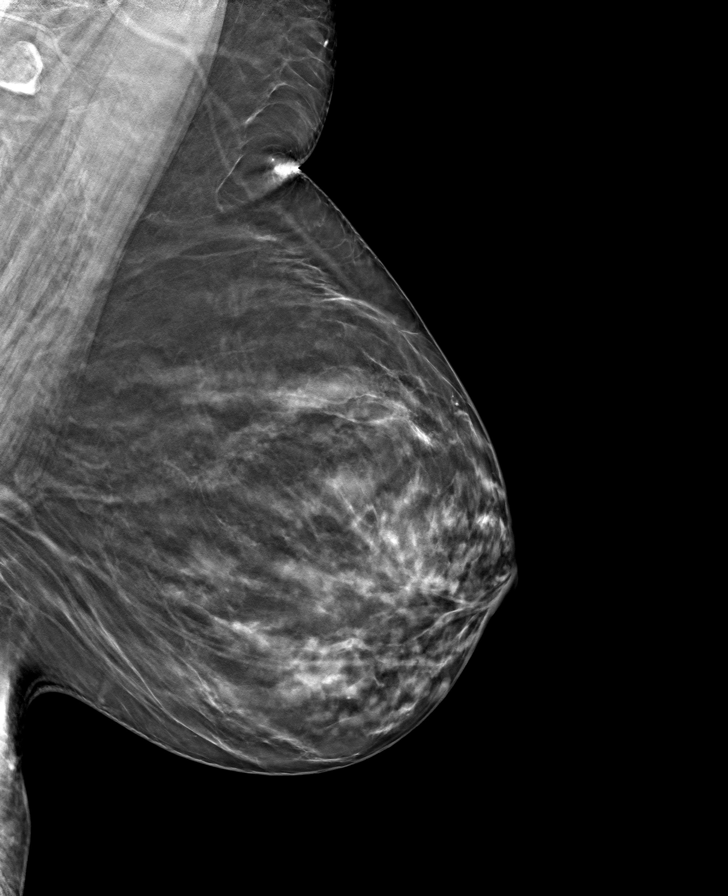

[L CC tomo · tomo slice 37/73.0]
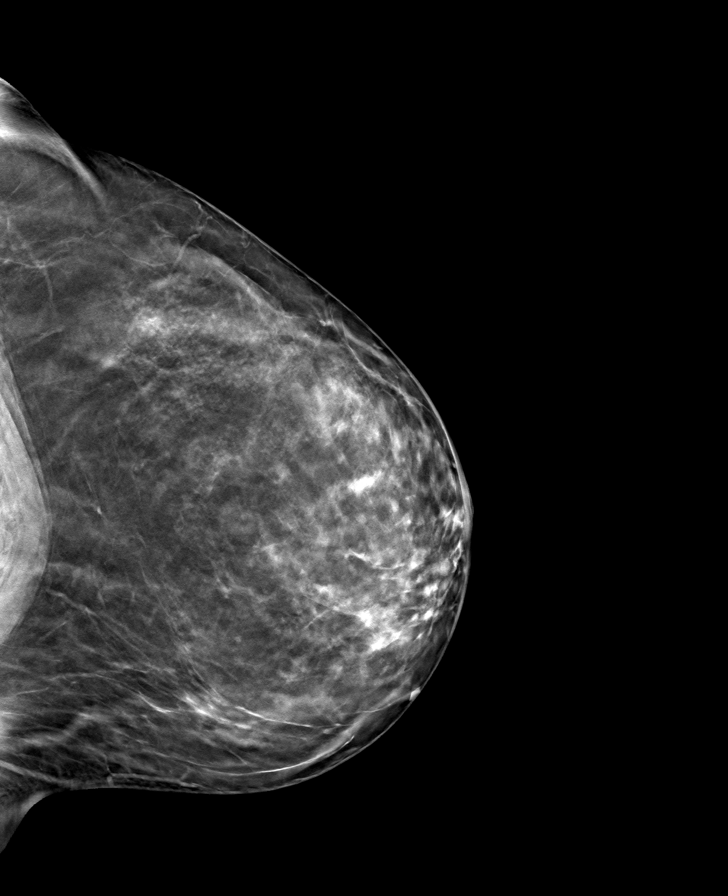

[8 of 24 positions shown; findings below may reference images not displayed]

ACR Breast Density Category b: There are scattered areas of
fibroglandular density.
FINDINGS: In the right breast, a possible asymmetry warrants further
evaluation. In the left breast, no findings suspicious for
malignancy.
IMPRESSION: Further evaluation is suggested for possible asymmetry in the right
breast.

RECOMMENDATION:
Diagnostic mammogram and possibly ultrasound of the right breast.
(Code:[id number])

The patient will be contacted regarding the findings, and additional
imaging will be scheduled.

BI-RADS CATEGORY  0: Incomplete. Need additional imaging evaluation
and/or prior mammograms for comparison.

## 2024-01-06 ENCOUNTER — Other Ambulatory Visit: Payer: Self-pay | Admitting: Sports Medicine

## 2024-01-06 ENCOUNTER — Other Ambulatory Visit: Payer: Self-pay | Admitting: Physician Assistant

## 2024-04-30 ENCOUNTER — Other Ambulatory Visit: Payer: Self-pay | Admitting: Physician Assistant

## 2024-05-22 ENCOUNTER — Telehealth: Payer: Self-pay | Admitting: Physician Assistant

## 2024-05-22 NOTE — Telephone Encounter (Signed)
 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 Patient requested to send it back via Call Patient to pick up within ASAP. Document is located in providers tray at front office.Please advise at Mobile [phone] (mobile).   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 Patient had DOT physical done at location her new employer sent her to. Following DOT physical completion, the place that completed the physical sent her with this form to be filled out by PCP. Patient is currently scheduled with PCP for regular CPE in February but doesn't have insurance currently. Patient states this needs to be completed before she can start her new job and thus gain new benefits. Patient's last CPE was 05/30/23.

## 2024-05-23 ENCOUNTER — Telehealth: Payer: Self-pay | Admitting: *Deleted

## 2024-05-23 ENCOUNTER — Encounter: Payer: Self-pay | Admitting: Oncology

## 2024-05-23 NOTE — Telephone Encounter (Signed)
 Called pt and informed her that per PCP an OV is needed for form completion. I informed patient of self pay estimate and pt verbalized understanding. Patient has been scheduled for 1/27 @ 8 am. She has been placed on waitlist.

## 2024-05-23 NOTE — Telephone Encounter (Signed)
 Copied from CRM #[id number]. 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

## 2024-05-23 NOTE — Telephone Encounter (Signed)
 Noted- Appointment on 05/27/24

## 2024-05-23 NOTE — Telephone Encounter (Signed)
 See other message

## 2024-05-27 ENCOUNTER — Ambulatory Visit: Payer: Self-pay | Admitting: Physician Assistant

## 2024-05-28 ENCOUNTER — Ambulatory Visit: Payer: Self-pay | Admitting: Physician Assistant

## 2024-05-28 VITALS — BP 132/84 | HR 91 | Temp 98.2°F | Ht 63.0 in | Wt 196.2 lb

## 2024-05-28 DIAGNOSIS — M545 Low back pain, unspecified: Secondary | ICD-10-CM

## 2024-05-28 DIAGNOSIS — G8929 Other chronic pain: Secondary | ICD-10-CM

## 2024-05-28 DIAGNOSIS — N951 Menopausal and female climacteric states: Secondary | ICD-10-CM

## 2024-05-28 DIAGNOSIS — F5101 Primary insomnia: Secondary | ICD-10-CM

## 2024-05-28 MED ORDER — TRAZODONE HCL 100 MG PO TABS: 100.0000 mg | ORAL_TABLET | Freq: Every day | ORAL | 5 refills | Status: AC

## 2024-05-28 MED ORDER — GABAPENTIN 600 MG PO TABS: 600.0000 mg | ORAL_TABLET | Freq: Every day | ORAL | 5 refills | Status: AC

## 2024-05-28 NOTE — Progress Notes (Signed)
 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    Discussed the use of AI scribe software for clinical note transcription with the patient, who gave verbal consent to proceed.  History of Present Illness   Emily [name] is a 49 year old female who presents for medication management and DOT physical requirements.  She is seeking clarification of her chronic medications for DOT clearance. She takes gabapentin  600 mg nightly for sleep, menopausal temperature regulation, and hip discomfort. She does not use daytime doses despite a three-times-daily prescription and denies daytime sedation from her current regimen.  She takes trazodone  100 mg nightly for sleep and needs a full eight hours to avoid morning grogginess. She denies excessive grogginess when combining trazodone  and gabapentin  at night.  She uses Aleve as needed for headaches and avoids ibuprofen due to prior colon polyps and gastrointestinal problems.  She is uninsured and relies on [facility] to afford medications while transitioning between jobs. 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       Past Medical History:  Diagnosis Date   ADHD    Arthritis 03/07/22   Lumbar   Bipolar 1 disorder (HCC)    GERD (gastroesophageal reflux disease)    H/O ULCER   Healthcare maintenance 07/03/2023   HLD (hyperlipidemia) 05/25/2021   Ulcer 25+ yrs ago   stress related     Social History[1]  Past Surgical History:  Procedure Laterality Date   TUBAL LIGATION     TUBES IN EARS AS A CHILD     VAGINAL DELIVERY     x3    Family History  Problem Relation Age of Onset   Breast cancer Mother    COPD Mother    Heart disease Mother    Hypertension Mother    Cancer  Mother    Heart attack Father    Hypertension Father    Heart disease Father    Cancer Maternal Aunt    Cancer Maternal Aunt    Lupus Maternal Aunt    Heart disease Maternal Grandmother    Heart disease Other    Cancer Other    Colon cancer Neg Hx    Colon polyps Neg Hx    Esophageal cancer Neg Hx    Stomach cancer Neg Hx    Rectal cancer Neg Hx     Allergies[2]  Current Medications:  Current Medications[3]   Review of Systems:   Negative unless otherwise specified per HPI.  Vitals:   Vitals:   05/28/24 1351  BP: 132/84  Pulse: 91  Temp: 98.2 F (36.8 C)  TempSrc: Temporal  SpO2: 94%  Weight: 196 lb 4 oz (89 kg)  Height: 5' 3 (1.6 m)     Body mass index is 34.76 kg/m.  Physical Exam:   Physical Exam Vitals and nursing note reviewed.  Constitutional:      General: She is not in acute distress.    Appearance: She is well-developed. She is not ill-appearing or toxic-appearing.  Cardiovascular:     Rate and Rhythm: Normal rate and regular rhythm.     Pulses: Normal pulses.     Heart sounds: Normal heart sounds, S1 normal and S2 normal.  Pulmonary:  Effort: Pulmonary effort is normal.     Breath sounds: Normal breath sounds.  Skin:    General: Skin is warm and dry.  Neurological:     Mental Status: She is alert.     GCS: GCS eye subscore is 4. GCS verbal subscore is 5. GCS motor subscore is 6.  Psychiatric:        Speech: Speech normal.        Behavior: Behavior normal. Behavior is cooperative.     Assessment and Plan:   Assessment and Plan    Menopausal syndrome Symptoms include hot flashes and temperature regulation issues. Gabapentin  used effectively without excessive grogginess. - Switched gabapentin  to 600 mg at night. - Sent gabapentin  prescription to [facility] on [address].  Chronic bilateral low back pain, unspecified whether sciatica present  Chronic back managed with gabapentin . Pain less severe than during previous employment.  Aleve used for headaches, ibuprofen avoided due to gastrointestinal issues. - Continue gabapentin  600 mg at night. - Use Aleve as needed for headaches.  Insomnia Managed with trazodone , effective for sleep with requirement of full 8 hours to avoid grogginess. - Continue trazodone  100 mg at night. - Sent trazodone  prescription to [facility] on [address].    Lucie Buttner, PA-C    [1]  Social History Tobacco Use   Smoking status: Every Day    Types: Cigars    Passive exposure: Past   Smokeless tobacco: Former  Building Services Engineer status: Former  Substance Use Topics   Alcohol use: Yes    Comment: rarely   Drug use: Yes    Types: Marijuana    Comment: regularly  [2] No Known Allergies [3]  Current Outpatient Medications:    gabapentin  (NEURONTIN ) 600 MG tablet, Take 1 tablet (600 mg total) by mouth at bedtime., Disp: 30 tablet, Rfl: 5   naproxen sodium (ALEVE) 220 MG tablet, Take 220 mg by mouth daily as needed., Disp: , Rfl:    estradiol (VIVELLE-DOT) 0.05 MG/24HR patch, Place 1 patch onto the skin 2 (two) times a week., Disp: , Rfl:    progesterone (PROMETRIUM) 100 MG capsule, Take 100 mg by mouth daily., Disp: , Rfl:    traZODone  (DESYREL ) 100 MG tablet, Take 1 tablet (100 mg total) by mouth at bedtime., Disp: 30 tablet, Rfl: 5  "

## 2024-06-04 ENCOUNTER — Encounter: Payer: BC Managed Care – PPO | Admitting: Physician Assistant

## 2025-06-01 ENCOUNTER — Encounter: Payer: Self-pay | Admitting: Physician Assistant
